# Patient Record
Sex: Female | Born: 1985 | Race: Black or African American | Hispanic: No | Marital: Single | Smoking: Current every day smoker
Health system: Southern US, Community
[De-identification: ages and names within clinical notes are randomized; demographics above are authoritative.]

## PROBLEM LIST (undated history)

## (undated) DIAGNOSIS — F319 Bipolar disorder, unspecified: Secondary | ICD-10-CM

## (undated) DIAGNOSIS — G43909 Migraine, unspecified, not intractable, without status migrainosus: Secondary | ICD-10-CM

## (undated) DIAGNOSIS — I10 Essential (primary) hypertension: Secondary | ICD-10-CM

## (undated) HISTORY — PX: WISDOM TOOTH EXTRACTION: SHX21

## (undated) HISTORY — PX: TONSILLECTOMY: SUR1361

---

## 2008-02-20 ENCOUNTER — Emergency Department (HOSPITAL_COMMUNITY): Admission: EM | Admit: 2008-02-20 | Discharge: 2008-02-20 | Payer: Self-pay | Admitting: Emergency Medicine

## 2008-06-01 ENCOUNTER — Emergency Department (HOSPITAL_COMMUNITY): Admission: EM | Admit: 2008-06-01 | Discharge: 2008-06-01 | Payer: Self-pay | Admitting: Emergency Medicine

## 2008-06-03 ENCOUNTER — Ambulatory Visit (HOSPITAL_COMMUNITY): Admission: RE | Admit: 2008-06-03 | Discharge: 2008-06-03 | Payer: Self-pay | Admitting: Emergency Medicine

## 2009-04-09 ENCOUNTER — Emergency Department (HOSPITAL_COMMUNITY): Admission: EM | Admit: 2009-04-09 | Discharge: 2009-04-09 | Payer: Self-pay | Admitting: Emergency Medicine

## 2010-06-24 LAB — URINE CULTURE

## 2010-06-24 LAB — POCT PREGNANCY, URINE: Preg Test, Ur: NEGATIVE

## 2010-06-24 LAB — COMPREHENSIVE METABOLIC PANEL
ALT: 25 U/L (ref 0–35)
Albumin: 3.2 g/dL — ABNORMAL LOW (ref 3.5–5.2)
Alkaline Phosphatase: 115 U/L (ref 39–117)
Chloride: 102 mEq/L (ref 96–112)
Potassium: 3.7 mEq/L (ref 3.5–5.1)
Sodium: 136 mEq/L (ref 135–145)
Total Protein: 6.9 g/dL (ref 6.0–8.3)

## 2010-06-24 LAB — URINALYSIS, ROUTINE W REFLEX MICROSCOPIC
Hgb urine dipstick: NEGATIVE
Nitrite: NEGATIVE
Protein, ur: NEGATIVE mg/dL
Urobilinogen, UA: 0.2 mg/dL (ref 0.0–1.0)

## 2010-06-24 LAB — DIFFERENTIAL
Basophils Relative: 0 % (ref 0–1)
Eosinophils Absolute: 0.2 10*3/uL (ref 0.0–0.7)
Eosinophils Relative: 3 % (ref 0–5)
Monocytes Absolute: 0.6 10*3/uL (ref 0.1–1.0)
Monocytes Relative: 8 % (ref 3–12)

## 2010-06-24 LAB — URINE MICROSCOPIC-ADD ON

## 2010-06-24 LAB — CBC
Hemoglobin: 10.4 g/dL — ABNORMAL LOW (ref 12.0–15.0)
Platelets: 348 10*3/uL (ref 150–400)
RDW: 17.3 % — ABNORMAL HIGH (ref 11.5–15.5)
WBC: 7.4 10*3/uL (ref 4.0–10.5)

## 2010-12-17 LAB — URINALYSIS, ROUTINE W REFLEX MICROSCOPIC
Glucose, UA: NEGATIVE mg/dL
Hgb urine dipstick: NEGATIVE
pH: 6 (ref 5.0–8.0)

## 2010-12-17 LAB — POCT I-STAT, CHEM 8
Calcium, Ion: 1.15 mmol/L (ref 1.12–1.32)
Chloride: 104 mEq/L (ref 96–112)
HCT: 35 % — ABNORMAL LOW (ref 36.0–46.0)
Potassium: 4.2 mEq/L (ref 3.5–5.1)

## 2010-12-17 LAB — URINE MICROSCOPIC-ADD ON

## 2011-03-01 ENCOUNTER — Ambulatory Visit: Payer: Self-pay

## 2014-08-13 ENCOUNTER — Encounter (HOSPITAL_COMMUNITY): Payer: Self-pay

## 2014-08-13 ENCOUNTER — Emergency Department (HOSPITAL_COMMUNITY)
Admission: EM | Admit: 2014-08-13 | Discharge: 2014-08-13 | Disposition: A | Discharge status: Home / Self Care | Payer: Self-pay | Attending: Emergency Medicine | Admitting: Emergency Medicine

## 2014-08-13 DIAGNOSIS — F329 Major depressive disorder, single episode, unspecified: Secondary | ICD-10-CM | POA: Insufficient documentation

## 2014-08-13 DIAGNOSIS — Z720 Tobacco use: Secondary | ICD-10-CM | POA: Insufficient documentation

## 2014-08-13 DIAGNOSIS — F32A Depression, unspecified: Secondary | ICD-10-CM

## 2014-08-13 LAB — COMPREHENSIVE METABOLIC PANEL
ALT: 16 U/L (ref 14–54)
AST: 17 U/L (ref 15–41)
Albumin: 4 g/dL (ref 3.5–5.0)
Alkaline Phosphatase: 117 U/L (ref 38–126)
Anion gap: 6 (ref 5–15)
BILIRUBIN TOTAL: 0.2 mg/dL — AB (ref 0.3–1.2)
BUN: 8 mg/dL (ref 6–20)
CALCIUM: 9 mg/dL (ref 8.9–10.3)
CO2: 27 mmol/L (ref 22–32)
Chloride: 103 mmol/L (ref 101–111)
Creatinine, Ser: 0.84 mg/dL (ref 0.44–1.00)
GFR calc Af Amer: >60 mL/min (ref >60)
Glucose, Bld: 96 mg/dL (ref 65–99)
POTASSIUM: 3.7 mmol/L (ref 3.5–5.1)
Sodium: 136 mmol/L (ref 135–145)
Total Protein: 8.4 g/dL — ABNORMAL HIGH (ref 6.5–8.1)

## 2014-08-13 LAB — CBC
HEMATOCRIT: 34.6 % — AB (ref 36.0–46.0)
HEMOGLOBIN: 11 g/dL — AB (ref 12.0–15.0)
MCH: 26.1 pg (ref 26.0–34.0)
MCHC: 31.8 g/dL (ref 30.0–36.0)
MCV: 82 fL (ref 78.0–100.0)
Platelets: 413 10*3/uL — ABNORMAL HIGH (ref 150–400)
RBC: 4.22 MIL/uL (ref 3.87–5.11)
RDW: 16.2 % — AB (ref 11.5–15.5)
WBC: 7.8 10*3/uL (ref 4.0–10.5)

## 2014-08-13 LAB — ACETAMINOPHEN LEVEL

## 2014-08-13 LAB — SALICYLATE LEVEL

## 2014-08-13 LAB — ETHANOL: Alcohol, Ethyl (B): <5 mg/dL (ref <5)

## 2014-08-13 NOTE — Discharge Instructions (Signed)
Please call 911 if you have any thoughts of hurting yourself. Please contact [facility] tomorrow-  i really think that you have depression,and i think they will be able to help you out.   Depression Depression refers to feeling sad, low, down in the dumps, blue, gloomy, or empty. In general, there are two kinds of depression:  Normal sadness or normal grief. This kind of depression is one that we all feel from time to time after upsetting life experiences, such as the loss of a job or the ending of a relationship. This kind of depression is considered normal, is short lived, and resolves within a few days to 2 weeks. Depression experienced after the loss of a loved one (bereavement) often lasts longer than 2 weeks but normally gets better with time.  Clinical depression. This kind of depression lasts longer than normal sadness or normal grief or interferes with your ability to function at home, at work, and in school. It also interferes with your personal relationships. It affects almost every aspect of your life. Clinical depression is an illness. Symptoms of depression can also be caused by conditions other than those mentioned above, such as:  Physical illness. Some physical illnesses, including underactive thyroid gland (hypothyroidism), severe anemia, specific types of cancer, diabetes, uncontrolled seizures, heart and lung problems, strokes, and chronic pain are commonly associated with symptoms of depression.  Side effects of some prescription medicine. In some people, certain types of medicine can cause symptoms of depression.  Substance abuse. Abuse of alcohol and illicit drugs can cause symptoms of depression. SYMPTOMS Symptoms of normal sadness and normal grief include the following:  Feeling sad or crying for short periods of time.  Not caring about anything (apathy).  Difficulty sleeping or sleeping too much.  No longer able to enjoy the things you used to enjoy.  Desire to be  by oneself all the time (social isolation).  Lack of energy or motivation.  Difficulty concentrating or remembering.  Change in appetite or weight.  Restlessness or agitation. Symptoms of clinical depression include the same symptoms of normal sadness or normal grief and also the following symptoms:  Feeling sad or crying all the time.  Feelings of guilt or worthlessness.  Feelings of hopelessness or helplessness.  Thoughts of suicide or the desire to harm yourself (suicidal ideation).  Loss of touch with reality (psychotic symptoms). Seeing or hearing things that are not real (hallucinations) or having false beliefs about your life or the people around you (delusions and paranoia). DIAGNOSIS  The diagnosis of clinical depression is usually based on how bad the symptoms are and how long they have lasted. Your health care provider will also ask you questions about your medical history and substance use to find out if physical illness, use of prescription medicine, or substance abuse is causing your depression. Your health care provider may also order blood tests. TREATMENT  Often, normal sadness and normal grief do not require treatment. However, sometimes antidepressant medicine is given for bereavement to ease the depressive symptoms until they resolve. The treatment for clinical depression depends on how bad the symptoms are but often includes antidepressant medicine, counseling with a mental health professional, or both. Your health care provider will help to determine what treatment is best for you. Depression caused by physical illness usually goes away with appropriate medical treatment of the illness. If prescription medicine is causing depression, talk with your health care provider about stopping the medicine, decreasing the dose, or changing to another  medicine. Depression caused by the abuse of alcohol or illicit drugs goes away when you stop using these substances. Some adults  need professional help in order to stop drinking or using drugs. SEEK IMMEDIATE MEDICAL CARE IF:  You have thoughts about hurting yourself or others.  You lose touch with reality (have psychotic symptoms).  You are taking medicine for depression and have a serious side effect. FOR MORE INFORMATION  National Alliance on Mental Illness: www.nami.[employer] of Mental Health: http://www.maynard.net/ Document Released: 02/26/2000 Document Revised: 07/15/2013 Document Reviewed: 05/30/2011 [facility] Patient Information 2015 [place], [place]. This information is not intended to replace advice given to you by your health care provider. Make sure you discuss any questions you have with your health care provider.  Suicidal Feelings, How to Help Yourself Everyone feels sad or unhappy at times, but depressing thoughts and feelings of hopelessness can lead to thoughts of suicide. It can seem as if life is too tough to handle. If you feel as though you have reached the point where suicide is the only answer, it is time to let someone know immediately.  HOW TO COPE AND PREVENT SUICIDE  Let family, friends, teachers, or counselors know. Get help. Try not to isolate yourself from those who care about you. Even though you may not feel sociable, talk with someone every day. It is best if it is face-to-face. Remember, they will want to help you.  Eat a regularly spaced and well-balanced diet.  Get plenty of rest.  Avoid alcohol and drugs because they will only make you feel worse and may also lower your inhibitions. Remove them from the home. If you are thinking of taking an overdose of your prescribed medicines, give your medicines to someone who can give them to you one day at a time. If you are on antidepressants, let your caregiver know of your feelings so he or she can provide a safer medicine, if that is a concern.  Remove weapons or poisons from your home.  Try to stick to routines. Follow a  schedule and remind yourself that you have to keep that schedule every day.  Set some realistic goals and achieve them. Make a list and cross things off as you go. Accomplishments give a sense of worth. Wait until you are feeling better before doing things you find difficult or unpleasant to do.  If you are able, try to start exercising. Even half-hour periods of exercise each day will make you feel better. Getting out in the sun or into nature helps you recover from depression faster. If you have a favorite place to walk, take advantage of that.  Increase safe activities that have always given you pleasure. This may include playing your favorite music, reading a good book, painting a picture, or playing your favorite instrument. Do whatever takes your mind off your depression.  Keep your living space well-lighted. GET HELP Contact a suicide hotline, crisis center, or local suicide prevention center for help right away. Local centers may include a hospital, clinic, community service organization, social service provider, or health department.  Call your local emergency services (911 in the [place]).  Call a suicide hotline:  1-800-273-TALK (831-146-4035) in the [place].  1-800-SUICIDE (661) 222-6946) in the [place].  630-587-1790 in the [place] for Spanish-speaking counselors.  4-696-295-2WUX (737)036-4494) in the [place] for TTY users.  Visit the following websites for information and help:  National Suicide Prevention Lifeline: www.suicidepreventionlifeline.org  Hopeline: www.hopeline.com  [employer]  for Suicide Prevention: https://www.ayers.com/  For lesbian, gay, bisexual, transgender, or questioning youth, contact [employer]:  1-610-9-U-EAVWUJ 603-391-3025) in the [place].  www.thetrevorproject.org  In [place], treatment resources are listed in each province with listings available under [employer] or  similar titles. Another source for Crisis Centres by [place] is located at http://www.suicideprevention.ca/in-crisis-now/find-a-crisis-centre-now/crisis-centres Document Released: 09/04/2002 Document Revised: 05/23/2011 Document Reviewed: 06/25/2013 [facility] Patient Information 2015 [place], [place]. This information is not intended to replace advice given to you by your health care provider. Make sure you discuss any questions you have with your health care provider.

## 2014-08-13 NOTE — ED Notes (Signed)
Pt presents with c/o suicidal thoughts. Pt reports she was calling to seek some help. Visitor at bedside reported mobile crisis was called and that pt made a statement about having past thoughts about driving her car off of the bridge. Visitor also stated that last night pt stated that "sometimes she thinks it would be better if she wasn't here but that she still has stuff to do". Pt denies feeling suicidal but reports "sometimes I think it would be better if I wasn't here". Pt denies homicidal thoughts. Calm and cooperative at this time.

## 2014-08-13 NOTE — ED Notes (Signed)
MD at bedside. 

## 2014-08-13 NOTE — ED Provider Notes (Signed)
CSN: [id number]     Arrival date & time 08/13/14  0132 History   First MD Initiated Contact with Patient 08/13/14 [phone]     Chief Complaint  Patient presents with  . Suicidal     (Consider location/radiation/quality/duration/timing/severity/associated sxs/prior Treatment) HPI Comments: Pt brought in by the crisis team for suicidal thoughts. PT has no psych hx, medical hx and has no substance abuse hx. She reports that she had a really rough patch 2 years ago, that has left her scarred. She was getting very emotional today, and decided to "vent out" and open up to someone, rather then suffer by herself, so she called a suicide crisis line. She spoke with someone about her previous thoughts/plans of hurting herself (driving down a bridge), 2 years ago and 1 years ago. She told them how she gets suicidal thoughts even now when she gets down - but that she has no plans, and that she is looking forward to settling down, getting married and have kids. Pt however was sent to the Er for further evaluation. She reports she absolutely has no suicidal plans and that she is considering moving back to [place] - where she has more support system. Pt is not currently employed. She has no new stress. She reports that she was on her way to buy some Chicken before the crisis team got there, and she has a dog to take care of.  The history is provided by the patient.    History reviewed. No pertinent past medical history. Past Surgical History  Procedure Laterality Date  . Tonsillectomy    . Wisdom tooth extraction     No family history on file. History  Substance Use Topics  . Smoking status: Current Every Day Smoker  . Smokeless tobacco: Not on file  . Alcohol Use: No   OB History    No data available     Review of Systems  Constitutional: Positive for activity change.  Gastrointestinal: Negative for nausea.  Psychiatric/Behavioral: Negative for suicidal ideas, hallucinations and self-injury.       Allergies  Review of patient's allergies indicates no known allergies.  Home Medications   Prior to Admission medications   Medication Sig Start Date End Date Taking? Authorizing Provider  acetaminophen (TYLENOL) 500 MG tablet Take 500-1,000 mg by mouth every 6 (six) hours as needed (for pain.).   Yes Historical Provider, MD   BP 149/117 mmHg  Pulse 92  Temp(Src) 98.7 F (37.1 C) (Oral)  Resp 20  SpO2 100%  LMP 07/20/2014 (Exact Date) Physical Exam  Constitutional: She is oriented to person, place, and time. She appears well-developed and well-nourished.  HENT:  Head: Normocephalic and atraumatic.  Eyes: EOM are normal. Pupils are equal, round, and reactive to light.  Neck: Neck supple.  Cardiovascular: Normal rate, regular rhythm and normal heart sounds.   No murmur heard. Pulmonary/Chest: Effort normal. No respiratory distress.  Abdominal: Soft. She exhibits no distension. There is no tenderness. There is no rebound and no guarding.  Neurological: She is alert and oriented to person, place, and time.  Skin: Skin is warm and dry.  Psychiatric: She has a normal mood and affect. Her behavior is normal. Judgment and thought content normal.  Nursing note and vitals reviewed.   ED Course  Procedures (including critical care time) Labs Review Labs Reviewed  ACETAMINOPHEN LEVEL - Abnormal; Notable for the following:    Acetaminophen (Tylenol), Serum <10 (*)    All other components within normal limits  CBC - Abnormal; Notable for the following:    Hemoglobin 11.0 (*)    HCT 34.6 (*)    RDW 16.2 (*)    Platelets 413 (*)    All other components within normal limits  COMPREHENSIVE METABOLIC PANEL - Abnormal; Notable for the following:    Total Protein 8.4 (*)    Total Bilirubin 0.2 (*)    All other components within normal limits  ETHANOL  SALICYLATE LEVEL  URINE RAPID DRUG SCREEN (HOSP PERFORMED) NOT AT [facility]  POC URINE PREG, ED    Imaging Review No results  found.   EKG Interpretation None      MDM   Final diagnoses:  Depression    Pt sent here for evaluation for suicidal thoughts. Currently, she vehemently denies any suicidal plans. She did feels down, and has had even suicidal thoughts - but she definitely has no plans. She has thinks to look forward to...have kids, take care of her dogs, move closer to her family.  Now she has never been diagnosed with depression, and we both agree that she likely is depressed. She has a phone # for [facility] and intends to call the tomorrow.  Pt has no medical problems, no access to guns, she has no pills at home, she has no hx of substance abuse - all in her favor. She has poor social support and is unemployed, which are red flags.  However, after speaking with this lady for 15-20 minutes-  i believe her when she states that she indeed wants to be happier and is looking forward to a certain things in her future.   Derwood KaplanAnkit Millena Callins, MD 08/13/14 [phone]

## 2015-07-11 ENCOUNTER — Encounter (HOSPITAL_COMMUNITY): Payer: Self-pay | Admitting: Emergency Medicine

## 2015-07-11 ENCOUNTER — Emergency Department (HOSPITAL_COMMUNITY): Payer: BLUE CROSS/BLUE SHIELD

## 2015-07-11 ENCOUNTER — Emergency Department (HOSPITAL_COMMUNITY)
Admission: EM | Admit: 2015-07-11 | Discharge: 2015-07-11 | Disposition: A | Discharge status: Home / Self Care | Payer: BLUE CROSS/BLUE SHIELD | Attending: Emergency Medicine | Admitting: Emergency Medicine

## 2015-07-11 DIAGNOSIS — R42 Dizziness and giddiness: Secondary | ICD-10-CM | POA: Diagnosis not present

## 2015-07-11 DIAGNOSIS — F17200 Nicotine dependence, unspecified, uncomplicated: Secondary | ICD-10-CM | POA: Insufficient documentation

## 2015-07-11 DIAGNOSIS — G43809 Other migraine, not intractable, without status migrainosus: Secondary | ICD-10-CM | POA: Diagnosis not present

## 2015-07-11 DIAGNOSIS — R51 Headache: Secondary | ICD-10-CM | POA: Diagnosis present

## 2015-07-11 LAB — CBC WITH DIFFERENTIAL/PLATELET
BASOS PCT: 0 %
Basophils Absolute: 0 10*3/uL (ref 0.0–0.1)
EOS ABS: 0.2 10*3/uL (ref 0.0–0.7)
EOS PCT: 3 %
HCT: 34.9 % — ABNORMAL LOW (ref 36.0–46.0)
HEMOGLOBIN: 11.3 g/dL — AB (ref 12.0–15.0)
Lymphocytes Relative: 24 %
Lymphs Abs: 1.6 10*3/uL (ref 0.7–4.0)
MCH: 27.6 pg (ref 26.0–34.0)
MCHC: 32.4 g/dL (ref 30.0–36.0)
MCV: 85.3 fL (ref 78.0–100.0)
Monocytes Absolute: 0.6 10*3/uL (ref 0.1–1.0)
Monocytes Relative: 9 %
NEUTROS PCT: 64 %
Neutro Abs: 4.4 10*3/uL (ref 1.7–7.7)
PLATELETS: 358 10*3/uL (ref 150–400)
RBC: 4.09 MIL/uL (ref 3.87–5.11)
RDW: 18.3 % — ABNORMAL HIGH (ref 11.5–15.5)
WBC: 6.8 10*3/uL (ref 4.0–10.5)

## 2015-07-11 LAB — BASIC METABOLIC PANEL
ANION GAP: 9 (ref 5–15)
BUN: 7 mg/dL (ref 6–20)
CALCIUM: 9 mg/dL (ref 8.9–10.3)
CO2: 26 mmol/L (ref 22–32)
Chloride: 104 mmol/L (ref 101–111)
Creatinine, Ser: 0.82 mg/dL (ref 0.44–1.00)
GFR calc Af Amer: >60 mL/min (ref >60)
GLUCOSE: 104 mg/dL — AB (ref 65–99)
Potassium: 3.4 mmol/L — ABNORMAL LOW (ref 3.5–5.1)
SODIUM: 139 mmol/L (ref 135–145)

## 2015-07-11 MED ORDER — DIPHENHYDRAMINE HCL 50 MG/ML IJ SOLN
25.0000 mg | Freq: Once | INTRAMUSCULAR | Status: AC
  Administered 2015-07-11: 25 mg via INTRAVENOUS
  Filled 2015-07-11: qty 1

## 2015-07-11 MED ORDER — PROCHLORPERAZINE EDISYLATE 5 MG/ML IJ SOLN
10.0000 mg | Freq: Once | INTRAMUSCULAR | Status: AC
  Administered 2015-07-11: 10 mg via INTRAVENOUS
  Filled 2015-07-11: qty 2

## 2015-07-11 MED ORDER — KETOROLAC TROMETHAMINE 30 MG/ML IJ SOLN
30.0000 mg | Freq: Once | INTRAMUSCULAR | Status: AC
  Administered 2015-07-11: 30 mg via INTRAVENOUS
  Filled 2015-07-11: qty 1

## 2015-07-11 NOTE — ED Provider Notes (Signed)
CSN: [id number]     Arrival date & time 07/11/15  0225 History  By signing my name below, I, Emmanuella Mensah, attest that this documentation has been prepared under the direction and in the presence of Gilda Creasehristopher J Evangeline Utley, MD. Electronically Signed: Angelene GiovanniEmmanuella Mensah, ED Scribe. 07/11/2015. 2:39 AM.    Chief Complaint  Patient presents with  . Headache   Patient is a 30 y.o. female presenting with headaches. The history is provided by the patient. No language interpreter was used.  Headache Pain location:  Frontal Radiates to:  Does not radiate Onset quality:  Sudden Timing:  Constant Progression:  Worsening Chronicity:  Recurrent Similar to prior headaches: more severe.   Relieved by:  None tried Worsened by:  Nothing Ineffective treatments:  None tried Associated symptoms: dizziness and nausea   Associated symptoms: no fever, no syncope and no vomiting    HPI Comments: Emma Haynes is a 30 y.o. female who presents to the Emergency Department complaining of gradually worsening severe frontal HA onset 3 hours ago that woke her up from her sleep. She reports associated dizziness, nausea, and photophobia. She explains that she has a hx of migraines but not to this extent. No alleviating factors noted. Pt has not tried any medications prior to the arrival of the ambulance. No fever or vomiting.    History reviewed. No pertinent past medical history. Past Surgical History  Procedure Laterality Date  . Tonsillectomy    . Wisdom tooth extraction     Family History  Problem Relation Age of Onset  . Diabetes Mother   . Hypertension Mother    Social History  Substance Use Topics  . Smoking status: Current Every Day Smoker  . Smokeless tobacco: None  . Alcohol Use: No   OB History    No data available     Review of Systems  Constitutional: Negative for fever.  Cardiovascular: Negative for syncope.  Gastrointestinal: Positive for nausea. Negative for vomiting.   Neurological: Positive for dizziness and headaches.  All other systems reviewed and are negative.     Allergies  Review of patient's allergies indicates no known allergies.  Home Medications   Prior to Admission medications   Medication Sig Start Date End Date Taking? Authorizing Provider  acetaminophen (TYLENOL) 500 MG tablet Take 500-1,000 mg by mouth every 6 (six) hours as needed for mild pain or headache.    Yes Historical Provider, MD  RaNITidine HCl (ZANTAC PO) Take 1 tablet by mouth daily as needed (acid reflux).   Yes Historical Provider, MD   BP 142/73 mmHg  Pulse 60  Temp(Src) 98.6 F (37 C) (Oral)  Resp 20  Ht 5\' 6"  (1.676 m)  Wt 240 lb (108.863 kg)  BMI 38.76 kg/m2  SpO2 98%  LMP 07/10/2015 (Exact Date) Physical Exam  Constitutional: She is oriented to person, place, and time. She appears well-developed and well-nourished. No distress.  HENT:  Head: Normocephalic and atraumatic.  Right Ear: Hearing normal.  Left Ear: Hearing normal.  Nose: Nose normal.  Mouth/Throat: Oropharynx is clear and moist and mucous membranes are normal.  Eyes: Conjunctivae and EOM are normal. Pupils are equal, round, and reactive to light.  Neck: Normal range of motion. Neck supple.  Cardiovascular: Regular rhythm, S1 normal and S2 normal.  Exam reveals no gallop and no friction rub.   No murmur heard. Pulmonary/Chest: Effort normal and breath sounds normal. No respiratory distress. She exhibits no tenderness.  Abdominal: Soft. Normal appearance and bowel sounds are  normal. There is no hepatosplenomegaly. There is no tenderness. There is no rebound, no guarding, no tenderness at McBurney's point and negative Murphy's sign. No hernia.  Musculoskeletal: Normal range of motion.  Neurological: She is alert and oriented to person, place, and time. She has normal strength. No cranial nerve deficit or sensory deficit. Coordination normal. GCS eye subscore is 4. GCS verbal subscore is 5. GCS  motor subscore is 6.  Skin: Skin is warm, dry and intact. No rash noted. No cyanosis.  Psychiatric: She has a normal mood and affect. Her speech is normal and behavior is normal. Thought content normal.  Nursing note and vitals reviewed.   ED Course  Procedures (including critical care time) DIAGNOSTIC STUDIES: Oxygen Saturation is 100% on RA, normal by my interpretation.    COORDINATION OF CARE: 2:35 AM- Pt advised of plan for treatment and pt agrees. Pt will receive Head CT and lab work for further evaluation.    Labs Review Labs Reviewed  CBC WITH DIFFERENTIAL/PLATELET - Abnormal; Notable for the following:    Hemoglobin 11.3 (*)    HCT 34.9 (*)    RDW 18.3 (*)    All other components within normal limits  BASIC METABOLIC PANEL - Abnormal; Notable for the following:    Potassium 3.4 (*)    Glucose, Bld 104 (*)    All other components within normal limits    Imaging Review Ct Head Wo Contrast  07/11/2015  CLINICAL DATA:  Gradually worsening severe frontal headache associated with dizziness, nausea and photophobia. History of migraines. EXAM: CT HEAD WITHOUT CONTRAST TECHNIQUE: Contiguous axial images were obtained from the base of the skull through the vertex without intravenous contrast. COMPARISON:  None. FINDINGS: INTRACRANIAL CONTENTS: The ventricles and sulci are normal. No intraparenchymal hemorrhage, mass effect nor midline shift. No acute large vascular territory infarcts. No abnormal extra-axial fluid collections. Basal cisterns are patent. ORBITS: The included ocular globes and orbital contents are normal. SINUSES: The mastoid aircells and included paranasal sinuses are well-aerated. Coalescent LEFT mastoid air cell suggests prior mastoiditis without acute component. SKULL/SOFT TISSUES: No skull fracture. No significant soft tissue swelling. IMPRESSION: Normal CT HEAD. Electronically Signed   By: Awilda Metro M.D.   On: 07/11/2015 04:59     Gilda Crease, MD  has personally reviewed and evaluated these images and lab results as part of his medical decision-making.   EKG Interpretation None      MDM   Final diagnoses:  Other type of migraine    Patient presents to the emergency department for evaluation of migraine headache. Patient reports that she had onset of headache 3 hours before arrival in the ER. She does have a history of recurrent headaches, but has not had a formal diagnosis of migraine. Because the headache is worse than it has been in the past, CT scan was performed. It was negative. Negative CT scan within 3 hours of onset of bleeding is a good indicator that there is no evidence of subarachnoid hemorrhage. She also had a normal neurologic exam and symptoms were very consistent with migraine. She had resolution of her headache with migraine treatment. Patient was referred to neurology for further management.  I personally performed the services described in this documentation, which was scribed in my presence. The recorded information has been reviewed and is accurate.    Gilda Crease, MD 07/11/15 [phone]

## 2015-07-11 NOTE — ED Notes (Signed)
Per EMS: Pt from home complaining of migraine x 3 hours. Hx of same, never diagnosed. Pt nauseated, EMS gave 8mg  zofran enroute. No improvement. Pt a/o x 4.

## 2015-07-11 NOTE — Discharge Instructions (Signed)

## 2016-04-26 ENCOUNTER — Emergency Department (HOSPITAL_COMMUNITY)
Admission: EM | Admit: 2016-04-26 | Discharge: 2016-04-27 | Disposition: A | Discharge status: Home / Self Care | Payer: BLUE CROSS/BLUE SHIELD | Attending: Emergency Medicine | Admitting: Emergency Medicine

## 2016-04-26 ENCOUNTER — Encounter (HOSPITAL_COMMUNITY): Payer: Self-pay | Admitting: *Deleted

## 2016-04-26 DIAGNOSIS — F17200 Nicotine dependence, unspecified, uncomplicated: Secondary | ICD-10-CM | POA: Diagnosis not present

## 2016-04-26 DIAGNOSIS — K2900 Acute gastritis without bleeding: Secondary | ICD-10-CM | POA: Diagnosis not present

## 2016-04-26 DIAGNOSIS — R51 Headache: Secondary | ICD-10-CM | POA: Diagnosis not present

## 2016-04-26 DIAGNOSIS — R110 Nausea: Secondary | ICD-10-CM | POA: Diagnosis present

## 2016-04-26 DIAGNOSIS — R519 Headache, unspecified: Secondary | ICD-10-CM

## 2016-04-26 HISTORY — DX: Migraine, unspecified, not intractable, without status migrainosus: G43.909

## 2016-04-26 LAB — I-STAT CHEM 8, ED
BUN: 8 mg/dL (ref 6–20)
CALCIUM ION: 1.13 mmol/L — AB (ref 1.15–1.40)
Chloride: 100 mmol/L — ABNORMAL LOW (ref 101–111)
Creatinine, Ser: 0.8 mg/dL (ref 0.44–1.00)
Glucose, Bld: 100 mg/dL — ABNORMAL HIGH (ref 65–99)
HEMATOCRIT: 32 % — AB (ref 36.0–46.0)
HEMOGLOBIN: 10.9 g/dL — AB (ref 12.0–15.0)
Potassium: 5.6 mmol/L — ABNORMAL HIGH (ref 3.5–5.1)
SODIUM: 137 mmol/L (ref 135–145)
TCO2: 31 mmol/L (ref 0–100)

## 2016-04-26 LAB — I-STAT BETA HCG BLOOD, ED (MC, WL, AP ONLY)

## 2016-04-26 MED ORDER — PANTOPRAZOLE SODIUM 40 MG IV SOLR
40.0000 mg | Freq: Once | INTRAVENOUS | Status: AC
  Administered 2016-04-26: 40 mg via INTRAVENOUS
  Filled 2016-04-26: qty 40

## 2016-04-26 MED ORDER — KETOROLAC TROMETHAMINE 30 MG/ML IJ SOLN
30.0000 mg | Freq: Once | INTRAMUSCULAR | Status: AC
  Administered 2016-04-26: 30 mg via INTRAVENOUS
  Filled 2016-04-26: qty 1

## 2016-04-26 MED ORDER — GI COCKTAIL ~~LOC~~
30.0000 mL | Freq: Once | ORAL | Status: AC
  Administered 2016-04-26: 30 mL via ORAL
  Filled 2016-04-26: qty 30

## 2016-04-26 MED ORDER — METOCLOPRAMIDE HCL 5 MG/ML IJ SOLN
10.0000 mg | Freq: Once | INTRAMUSCULAR | Status: AC
  Administered 2016-04-26: 10 mg via INTRAVENOUS
  Filled 2016-04-26: qty 2

## 2016-04-26 NOTE — ED Provider Notes (Signed)
[facility] Provider Note   CSN: [id number] Arrival date & time: 04/26/16  2129   By signing my name below, I, Marnette Burgess Long, attest that this documentation has been prepared under the direction and in the presence of [employer], PA-C. Electronically Signed: Marnette Burgess Long, Scribe. 04/26/2016. 11:43 PM.   History   Chief Complaint Chief Complaint  Patient presents with  . Nausea     The history is provided by the patient. No language interpreter was used.    HPI Comments:  Emma Haynes is a 31 y.o. female with a PMHx of Anemia, Gastritis, and h/o Migraines, who presents to the Emergency Department complaining of constant nausea onset three days. She notes the nausea has been present for the last three days but worsened today PTA. She has associated symptoms of a constant, dull HA and mild cough. She reports normal BM. She states taking some anti-emetics at home with no relief of her nausea. Eating exacerbates her symptoms. No h/o abdominal surgeries. She has a h/o migraines which lead to her being seen at [facility] in April 2017. She did not take anything at home today for relief of for her HA.  Pt denies fever, vomiting, rhinorrhea, diarrhea, urgency, frequency, hematuria, dysuria, difficulty urinating, and any other associated symptoms at this time. Pt did receive a seasonal influenza vaccination.     Past Medical History:  Diagnosis Date  . Migraine     There are no active problems to display for this patient.   Past Surgical History:  Procedure Laterality Date  . TONSILLECTOMY    . WISDOM TOOTH EXTRACTION      OB History    No data available       Home Medications    Prior to Admission medications   Medication Sig Start Date End Date Taking? Authorizing Provider  acetaminophen (TYLENOL) 500 MG tablet Take 500-1,000 mg by mouth every 6 (six) hours as needed for mild pain or headache.    Yes Historical Provider, MD  RaNITidine HCl (ZANTAC PO) Take 1 tablet  by mouth daily as needed (acid reflux).   Yes Historical Provider, MD  butalbital-acetaminophen-caffeine (FIORICET, ESGIC) [phone] MG tablet Take 1-2 tablets by mouth every 8 (eight) hours as needed for headache. 04/27/16 04/27/17  Antony Madura, PA-C  pantoprazole (PROTONIX) 20 MG tablet Take 1 tablet (20 mg total) by mouth daily. 04/27/16   Antony Madura, PA-C    Family History Family History  Problem Relation Age of Onset  . Diabetes Mother   . Hypertension Mother     Social History Social History  Substance Use Topics  . Smoking status: Current Every Day Smoker  . Smokeless tobacco: Not on file  . Alcohol use No     Allergies   Patient has no known allergies.   Review of Systems Review of Systems 10 systems reviewed and all are negative for acute change except as noted in the HPI.    Physical Exam Updated Vital Signs BP 155/97 (BP Location: Right Arm)   Pulse 62   Temp 99.4 F (37.4 C) (Oral)   Resp 17   Ht 5\' 5"  (1.651 m)   Wt 108.4 kg   LMP 04/16/2016   SpO2 99%   BMI 39.77 kg/m   Physical Exam  Constitutional: She is oriented to person, place, and time. She appears well-developed and well-nourished. No distress.  Nontoxic and in NAD  HENT:  Head: Normocephalic and atraumatic.  Eyes: Conjunctivae and EOM are normal. No  scleral icterus.  Neck: Normal range of motion.  No nuchal rigidity or meningismus  Cardiovascular: Normal rate, regular rhythm and intact distal pulses.   Pulmonary/Chest: Effort normal. No respiratory distress. She has no wheezes. She has no rales.  Respirations even and unlabored. Lungs clear bilaterally.  Abdominal: Soft. She exhibits no distension and no mass. There is no guarding.  Soft abdomen without focal tenderness. No masses or peritoneal signs. No guarding or rigidity.  Musculoskeletal: Normal range of motion.  Neurological: She is alert and oriented to person, place, and time. No cranial nerve deficit. She exhibits normal muscle  tone. Coordination normal.  GCS 15. Speech is goal oriented. No cranial nerve deficits appreciated; symmetric eyebrow raise, no facial drooping, tongue midline. Patient moving all extremities. Sensation to light touch intact. Patient ambulatory with steady gait.  Skin: Skin is warm and dry. No rash noted. She is not diaphoretic. No erythema. No pallor.  Psychiatric: She has a normal mood and affect. Her behavior is normal.  Nursing note and vitals reviewed.    ED Treatments / Results  DIAGNOSTIC STUDIES:  Oxygen Saturation is 100% on RA, normal by my interpretation.    COORDINATION OF CARE:  11:38 PM Discussed treatment plan with pt at bedside including lab work, anti-emetics, and migraine cocktail and pt agreed to plan. Discussed mildly elevated potassium level. No c/o chest pain. Suspect this to be secondary to mild hemolysis as no contributing history to influence this result. Patient verbalizes understanding.  1:19 AM Patient reassessed and states she feels better.  Labs (all labs ordered are listed, but only abnormal results are displayed) Labs Reviewed  I-STAT CHEM 8, ED - Abnormal; Notable for the following:       Result Value   Potassium 5.6 (*)    Chloride 100 (*)    Glucose, Bld 100 (*)    Calcium, Ion 1.13 (*)    Hemoglobin 10.9 (*)    HCT 32.0 (*)    All other components within normal limits  I-STAT BETA HCG BLOOD, ED ([facility], [facility], [facility] ONLY)    EKG  EKG Interpretation None       Radiology No results found.  Procedures Procedures (including critical care time)  Medications Ordered in ED Medications  ketorolac (TORADOL) 30 MG/ML injection 30 mg (30 mg Intravenous Given 04/26/16 2356)  metoCLOPramide (REGLAN) injection 10 mg (10 mg Intravenous Given 04/26/16 2356)  pantoprazole (PROTONIX) injection 40 mg (40 mg Intravenous Given 04/26/16 2355)  gi cocktail (Maalox,Lidocaine,Donnatal) (30 mLs Oral Given 04/26/16 2356)     Initial Impression / Assessment and Plan  / ED Course  I have reviewed the triage vital signs and the nursing notes.  Pertinent labs & imaging results that were available during my care of the patient were reviewed by me and considered in my medical decision making (see chart for details).     31 year old female presents to the emergency department for 2 complaints. Primary complaint is of nausea with cough. She has not had any significant abdominal pain. No vomiting or diarrhea. No melena or hematochezia. She has had similar symptoms in the past associated with gastritis. I do believe this to be the cause of patient's nausea today. She has had relief of her nausea with Protonix and GI cocktail. She further reports an associated headache consistent with other headaches in the past. Headache has resolved with Reglan and Toradol. Patient resting comfortably on reassessment with no complaints. She is comfortable with further management on an outpatient basis.  Supportive medications prescribed and return precautions given. Patient discharged in stable condition with no unaddressed concerns.   Final Clinical Impressions(s) / ED Diagnoses   Final diagnoses:  Acute gastritis without hemorrhage, unspecified gastritis type  Acute nonintractable headache, unspecified headache type    New Prescriptions Discharge Medication List as of 04/27/2016  1:21 AM    START taking these medications   Details  butalbital-acetaminophen-caffeine (FIORICET, ESGIC) 50-325-40 MG tablet Take 1-2 tablets by mouth every 8 (eight) hours as needed for headache., Starting Wed 04/27/2016, Until Thu 04/27/2017, Print    pantoprazole (PROTONIX) 20 MG tablet Take 1 tablet (20 mg total) by mouth daily., Starting Wed 04/27/2016, Print       I personally performed the services described in this documentation, which was scribed in my presence. The recorded information has been reviewed and is accurate.       Antony MaduraKelly Eleni Frank, PA-C 04/27/16 [id number]    Shon Batonourtney F Horton,  MD 04/28/16 [id number]

## 2016-04-26 NOTE — ED Triage Notes (Signed)
Pt reports nausea for 3 days and a migraine since yesterday. Hx of migraines, no meds taken for the same. Denies fevers at home.

## 2016-04-27 MED ORDER — PANTOPRAZOLE SODIUM 20 MG PO TBEC: 20.0000 mg | DELAYED_RELEASE_TABLET | Freq: Every day | ORAL | 1 refills | Status: AC

## 2016-04-27 MED ORDER — BUTALBITAL-APAP-CAFFEINE 50-325-40 MG PO TABS: 1.0000 | ORAL_TABLET | Freq: Three times a day (TID) | ORAL | 0 refills | Status: AC | PRN

## 2017-04-19 ENCOUNTER — Encounter (HOSPITAL_COMMUNITY): Payer: Self-pay | Admitting: Emergency Medicine

## 2017-04-19 ENCOUNTER — Other Ambulatory Visit: Payer: Self-pay

## 2017-04-19 ENCOUNTER — Ambulatory Visit (HOSPITAL_COMMUNITY)
Admission: EM | Admit: 2017-04-19 | Discharge: 2017-04-19 | Disposition: A | Discharge status: Home / Self Care | Payer: BLUE CROSS/BLUE SHIELD | Attending: Family Medicine | Admitting: Family Medicine

## 2017-04-19 DIAGNOSIS — J209 Acute bronchitis, unspecified: Secondary | ICD-10-CM

## 2017-04-19 DIAGNOSIS — H1031 Unspecified acute conjunctivitis, right eye: Secondary | ICD-10-CM

## 2017-04-19 MED ORDER — AZITHROMYCIN 250 MG PO TABS: 250.0000 mg | ORAL_TABLET | Freq: Every day | ORAL | 0 refills | Status: AC

## 2017-04-19 MED ORDER — BENZONATATE 100 MG PO CAPS: 100.0000 mg | ORAL_CAPSULE | Freq: Three times a day (TID) | ORAL | 0 refills | Status: AC

## 2017-04-19 MED ORDER — OFLOXACIN 0.3 % OP SOLN: OPHTHALMIC | 0 refills | Status: AC

## 2017-04-19 NOTE — ED Provider Notes (Signed)
[facility]    CSN: [id number] Arrival date & time: 04/19/17  1557     History   Chief Complaint Chief Complaint  Patient presents with  . Eye Pain  . Eye Drainage  . Nasal Congestion    HPI Emma Haynes is a 32 y.o. female.   32 year old female comes in for 2-3 week history of URI symptoms, and 3 day history of eye drainage and pain.  She has had productive cough, nasal congestion, rhinorrhea.  Denies fever, chills, night sweats.  States has had worsening of productive cough this past few days.  She is noticed right eye drainage with redness for the past 2-3 days.  States it started out as foreign body sensation, now with crusting in the morning with intermittent blurry vision.  Denies photophobia, injury.  States she use OTC the pinkeye eyedrop.  She took 3 doses of amoxicillin, Mucinex, cough syrup for her URI symptoms with some relief.  She is a current some day smoker, states she smokes when she works.  Patient states with week vision of the left eye, is supposed to wear contact for the left eye, but does not.  States right eye vision is normal, and does not require contacts.      Past Medical History:  Diagnosis Date  . Migraine     There are no active problems to display for this patient.   Past Surgical History:  Procedure Laterality Date  . TONSILLECTOMY    . WISDOM TOOTH EXTRACTION      OB History    No data available       Home Medications    Prior to Admission medications   Medication Sig Start Date End Date Taking? Authorizing Provider  acetaminophen (TYLENOL) 500 MG tablet Take 500-1,000 mg by mouth every 6 (six) hours as needed for mild pain or headache.     [provider]  azithromycin (ZITHROMAX) 250 MG tablet Take 1 tablet (250 mg total) by mouth daily. Take first 2 tablets together, then 1 every day until finished. 04/19/17   Cathie Hoops, Amy V, PA-C  benzonatate (TESSALON) 100 MG capsule Take 1 capsule (100 mg total) by mouth every 8  (eight) hours. 04/19/17   Belinda Fisher, PA-C  butalbital-acetaminophen-caffeine (FIORICET, ESGIC) [phone] MG tablet Take 1-2 tablets by mouth every 8 (eight) hours as needed for headache. 04/27/16 04/27/17  Antony Madura, PA-C  ofloxacin (OCUFLOX) 0.3 % ophthalmic solution 1 drop every 4 hours for the first 2 days, then 4 times a day for 5 days 04/19/17   Cathie Hoops, Amy V, PA-C  pantoprazole (PROTONIX) 20 MG tablet Take 1 tablet (20 mg total) by mouth daily. 04/27/16   Antony Madura, PA-C  RaNITidine HCl (ZANTAC PO) Take 1 tablet by mouth daily as needed (acid reflux).    [provider]    Family History Family History  Problem Relation Age of Onset  . Diabetes Mother   . Hypertension Mother     Social History Social History   Tobacco Use  . Smoking status: Current Every Day Smoker  . Smokeless tobacco: Current User  Substance Use Topics  . Alcohol use: No  . Drug use: No     Allergies   Patient has no known allergies.   Review of Systems Review of Systems  Reason unable to perform ROS: See HPI as above.     Physical Exam Triage Vital Signs ED Triage Vitals  Enc Vitals Group     BP 04/19/17  1728 (!) 156/101     Pulse Rate 04/19/17 1728 71     Resp 04/19/17 1728 17     Temp 04/19/17 1728 98.3 F (36.8 C)     Temp Source 04/19/17 1728 Oral     SpO2 04/19/17 1728 100 %     Weight 04/19/17 1728 256 lb (116.1 kg)     Height 04/19/17 1728 5\' 6"  (1.676 m)     Head Circumference --      Peak Flow --      Pain Score 04/19/17 1729 7     Pain Loc --      Pain Edu? --      Excl. in GC? --    No data found.  Updated Vital Signs BP (!) 156/101 (BP Location: Left Arm)   Pulse 71   Temp 98.3 F (36.8 C) (Oral)   Resp 17   Ht 5\' 6"  (1.676 m)   Wt 256 lb (116.1 kg)   SpO2 100%   BMI 41.32 kg/m   Visual Acuity Right Eye Distance:   20/25 ([place]) Left Eye Distance:   20/400 ([place]) Bilateral Distance:    Right Eye Near:   Left Eye Near:    Bilateral Near:     Physical  Exam  Constitutional: She is oriented to person, place, and time. She appears well-developed and well-nourished. No distress.  HENT:  Head: Normocephalic and atraumatic.  Right Ear: Tympanic membrane, external ear and ear canal normal. Tympanic membrane is not erythematous and not bulging.  Left Ear: Tympanic membrane, external ear and ear canal normal. Tympanic membrane is not erythematous and not bulging.  Nose: Mucosal edema and rhinorrhea present. Right sinus exhibits no maxillary sinus tenderness and no frontal sinus tenderness. Left sinus exhibits no maxillary sinus tenderness and no frontal sinus tenderness.  Mouth/Throat: Uvula is midline, oropharynx is clear and moist and mucous membranes are normal.  Eyes: EOM and lids are normal. Pupils are equal, round, and reactive to light. Lids are everted and swept, no foreign bodies found. Right conjunctiva is injected. Left conjunctiva is not injected.  No ciliary injection.  Neck: Normal range of motion. Neck supple.  Cardiovascular: Normal rate, regular rhythm and normal heart sounds. Exam reveals no gallop and no friction rub.  No murmur heard. Pulmonary/Chest: Effort normal and breath sounds normal. She has no decreased breath sounds. She has no wheezes. She has no rhonchi. She has no rales.  Lymphadenopathy:    She has no cervical adenopathy.  Neurological: She is alert and oriented to person, place, and time.  Skin: Skin is warm and dry.  Psychiatric: She has a normal mood and affect. Her behavior is normal. Judgment normal.     UC Treatments / Results  Labs (all labs ordered are listed, but only abnormal results are displayed) Labs Reviewed - No data to display  EKG  EKG Interpretation None       Radiology No results found.  Procedures Procedures (including critical care time)  Medications Ordered in UC Medications - No data to display   Initial Impression / Assessment and Plan / UC Course  I have reviewed the  triage vital signs and the nursing notes.  Pertinent labs & imaging results that were available during my care of the patient were reviewed by me and considered in my medical decision making (see chart for details).    Azithromycin for bronchitis.  Other symptomatic treatment discussed.  Ofloxacin as directed for right eye conjunctivitis.  Warm  compress, lid scrubs discussed.  Patient with significantly worse vision of the left eye, which is baseline for her.  Return precautions given.  Patient expresses understanding and agrees to plan.  Final Clinical Impressions(s) / UC Diagnoses   Final diagnoses:  Acute bronchitis, unspecified organism  Acute conjunctivitis of right eye, unspecified acute conjunctivitis type    ED Discharge Orders        Ordered    benzonatate (TESSALON) 100 MG capsule  Every 8 hours     04/19/17 1757    azithromycin (ZITHROMAX) 250 MG tablet  Daily     04/19/17 1757    ofloxacin (OCUFLOX) 0.3 % ophthalmic solution     04/19/17 1757        Belinda Fisher, PA-C 04/19/17 1805

## 2017-04-19 NOTE — ED Triage Notes (Signed)
Pt. Stated, My eye has been draining and eye pain since Monday. Pt's eye draining. I've also had a cold for about 2 weeks.

## 2017-04-19 NOTE — Discharge Instructions (Signed)
Azithromycin as directed. Tessalon for cough. Start flonase, zyrtec-D for nasal congestion/drainage. You can use over the counter nasal saline rinse such as neti pot for nasal congestion. Keep hydrated, your urine should be clear to pale yellow in color. Tylenol/motrin for fever and pain. Monitor for any worsening of symptoms, chest pain, shortness of breath, wheezing, swelling of the throat, follow up for reevaluation.   Use ofloxacin eyedrops as directed on right eye. Lid scrubs and warm compresses as directed. Monitor for any worsening of symptoms, changes in vision, sensitivity to light, eye swelling, follow up with ophthalmology for further evaluation.

## 2017-04-24 ENCOUNTER — Ambulatory Visit (HOSPITAL_COMMUNITY)
Admission: EM | Admit: 2017-04-24 | Discharge: 2017-04-24 | Disposition: A | Discharge status: Home / Self Care | Payer: Self-pay | Attending: Family Medicine | Admitting: Family Medicine

## 2017-04-24 ENCOUNTER — Encounter (HOSPITAL_COMMUNITY): Payer: Self-pay | Admitting: Emergency Medicine

## 2017-04-24 DIAGNOSIS — S0501XA Injury of conjunctiva and corneal abrasion without foreign body, right eye, initial encounter: Secondary | ICD-10-CM

## 2017-04-24 MED ORDER — TETRACAINE HCL 0.5 % OP SOLN
OPHTHALMIC | Status: AC
  Filled 2017-04-24: qty 4

## 2017-04-24 MED ORDER — FLUORESCEIN SODIUM 0.6 MG OP STRP
ORAL_STRIP | OPHTHALMIC | Status: AC
  Filled 2017-04-24: qty 1

## 2017-04-24 NOTE — Discharge Instructions (Addendum)
Given new symptoms and exam today, go to the ophthalmologist for further evaluation today.

## 2017-04-24 NOTE — ED Provider Notes (Signed)
[facility]    CSN: [id number] Arrival date & time: 04/24/17  1232     History   Chief Complaint Chief Complaint  Patient presents with  . Eye Problem    HPI Bosie HelperJamika Mabus is a 32 y.o. female.   32 year old female comes in for continued right eye pain/irritation after being seen 5 days ago. At that time, she had eye redness and discharge without vision changes, photophobia, she was given ofloxacin eye drop for conjunctivitis, which she has been using for the past 5 days.  She reports gradual worsening of foreign body sensation.  States noticed blurry vision starting 2 days ago.  Denies photophobia.  She continues with ofloxacin eyedrop, lid scrubs, warm compress without relief.  States due to foreign body sensation, she has continued to rub her eye.      Past Medical History:  Diagnosis Date  . Migraine     There are no active problems to display for this patient.   Past Surgical History:  Procedure Laterality Date  . TONSILLECTOMY    . WISDOM TOOTH EXTRACTION      OB History    No data available       Home Medications    Prior to Admission medications   Medication Sig Start Date End Date Taking? Authorizing Provider  acetaminophen (TYLENOL) 500 MG tablet Take 500-1,000 mg by mouth every 6 (six) hours as needed for mild pain or headache.    Yes [provider]  azithromycin (ZITHROMAX) 250 MG tablet Take 1 tablet (250 mg total) by mouth daily. Take first 2 tablets together, then 1 every day until finished. 04/19/17  Yes Dorothe Elmore V, PA-C  ofloxacin (OCUFLOX) 0.3 % ophthalmic solution 1 drop every 4 hours for the first 2 days, then 4 times a day for 5 days 04/19/17  Yes Sigrid Schwebach V, PA-C  benzonatate (TESSALON) 100 MG capsule Take 1 capsule (100 mg total) by mouth every 8 (eight) hours. 04/19/17   Belinda FisherYu, Rami Waddle V, PA-C  butalbital-acetaminophen-caffeine (FIORICET, ESGIC) [phone] MG tablet Take 1-2 tablets by mouth every 8 (eight) hours as needed for headache.  04/27/16 04/27/17  Antony MaduraHumes, Kelly, PA-C  pantoprazole (PROTONIX) 20 MG tablet Take 1 tablet (20 mg total) by mouth daily. 04/27/16   Antony MaduraHumes, Kelly, PA-C  RaNITidine HCl (ZANTAC PO) Take 1 tablet by mouth daily as needed (acid reflux).    [provider]    Family History Family History  Problem Relation Age of Onset  . Diabetes Mother   . Hypertension Mother     Social History Social History   Tobacco Use  . Smoking status: Current Every Day Smoker  . Smokeless tobacco: Current User  Substance Use Topics  . Alcohol use: No  . Drug use: No     Allergies   Patient has no known allergies.   Review of Systems Review of Systems  Reason unable to perform ROS: See HPI as above.     Physical Exam Triage Vital Signs ED Triage Vitals  Enc Vitals Group     BP 04/24/17 1322 (!) 162/108     Pulse Rate 04/24/17 1322 81     Resp 04/24/17 1322 16     Temp 04/24/17 1322 98.2 F (36.8 C)     Temp Source 04/24/17 1322 Oral     SpO2 04/24/17 1322 100 %     Weight 04/24/17 1320 246 lb (111.6 kg)     Height 04/24/17 1320 5\' 6"  (1.676  m)     Head Circumference --      Peak Flow --      Pain Score 04/24/17 1319 7     Pain Loc --      Pain Edu? --      Excl. in GC? --    No data found.  Updated Vital Signs BP (!) 162/108 (BP Location: Right Arm)   Pulse 81   Temp 98.2 F (36.8 C) (Oral)   Resp 16   Ht 5\' 6"  (1.676 m)   Wt 246 lb (111.6 kg)   LMP 04/14/2017   SpO2 100%   BMI 39.71 kg/m   Visual Acuity Right Eye Distance: 20/70 Left Eye Distance: 20/200(pt states this is her normal, she is supposed to wear contacts) Bilateral Distance:    Right Eye Near:   Left Eye Near:    Bilateral Near:     Physical Exam  Constitutional: She is oriented to person, place, and time. She appears well-developed and well-nourished. No distress.  HENT:  Head: Normocephalic and atraumatic.  Eyes: EOM are normal. Pupils are equal, round, and reactive to light. Right eye exhibits  no discharge and no hordeolum. No foreign body present in the right eye. Right conjunctiva is injected.  See picture below. Right upper lid with swelling, erythema without increased warmth, tenderness to palpation. Fluorescein stain with uptake at the 12 o'clock of right iris that extends slightly to the pupils.   IOP (tonopen): 21, 24, 26  Neurological: She is alert and oriented to person, place, and time.         UC Treatments / Results  Labs (all labs ordered are listed, but only abnormal results are displayed) Labs Reviewed - No data to display  EKG  EKG Interpretation None       Radiology No results found.  Procedures Procedures (including critical care time)  Medications Ordered in UC Medications - No data to display   Initial Impression / Assessment and Plan / UC Course  I have reviewed the triage vital signs and the nursing notes.  Pertinent labs & imaging results that were available during my care of the patient were reviewed by me and considered in my medical decision making (see chart for details).    32 year old female returns for worsening right eye pain, irritation after being seen 5 days ago and treated for conjunctivitis with ofloxacin. States started experiencing worsening vision 2 days ago without photophobia. Worsening foreign body sensation.  Fluorescein stain with corneal abrasion at the 12:00 region of the iris extending down to the pupils.  Given history and exam, will discharge patient in stable condition to the ophthalmologist office for further evaluation.   Case discussed with Dr. Tracie Harrier, who agrees to plan.  Final Clinical Impressions(s) / UC Diagnoses   Final diagnoses:  Abrasion of right cornea, initial encounter    ED Discharge Orders    None        Lurline Idol 04/24/17 1933

## 2017-04-24 NOTE — ED Triage Notes (Signed)
PT was seen here last Wednesday and given antibiotics for pink eye. PT reports continued eye pain, redness, a foreign body sensation, and blurred vision in affected eye.

## 2017-04-26 ENCOUNTER — Telehealth (HOSPITAL_COMMUNITY): Payer: Self-pay | Admitting: Emergency Medicine

## 2017-04-26 NOTE — Telephone Encounter (Signed)
Patient concerned for blood pressure readings.  Reports having headaches and continued issues with her eye and is currently going to ophthalmology office now for continued treatment of eye issue.  Patient reports blood pressure at home has been 140/101.  Patient dis performing random, frequent blood pressure readings.  Patient sounds very anxious.   Insurance kicks in the next few weeks   Spoke to dr Kellogglauenstein.  Patient to focus on eye issue and treatment.  Patient to get a pcp as soon as it is possible.  Meanwhile patient is to keep a blood pressure log, checking blood pressure once a day.  Instructed patient if she has chest pain, sob, numbness or tingling or the worst headache of her life to call 911/go to ed immediately.  Patient agreeable.  Patient states she is taking advil and tylenol for headache.  Told patient to limit advil use due to blood pressure.

## 2018-12-10 ENCOUNTER — Emergency Department (HOSPITAL_COMMUNITY)
Admission: EM | Admit: 2018-12-10 | Discharge: 2018-12-11 | Discharge status: Against Medical Advice | Payer: Managed Care, Other (non HMO) | Attending: Emergency Medicine | Admitting: Emergency Medicine

## 2018-12-10 ENCOUNTER — Encounter (HOSPITAL_COMMUNITY): Payer: Self-pay | Admitting: Emergency Medicine

## 2018-12-10 ENCOUNTER — Emergency Department (HOSPITAL_COMMUNITY): Payer: Managed Care, Other (non HMO)

## 2018-12-10 ENCOUNTER — Other Ambulatory Visit: Payer: Self-pay

## 2018-12-10 DIAGNOSIS — Z5321 Procedure and treatment not carried out due to patient leaving prior to being seen by health care provider: Secondary | ICD-10-CM | POA: Diagnosis not present

## 2018-12-10 DIAGNOSIS — R05 Cough: Secondary | ICD-10-CM | POA: Diagnosis present

## 2018-12-10 NOTE — ED Triage Notes (Signed)
Patient here from home with complaints of cough x2 months. Reports that she has mold in apartment and would like to get checked.

## 2019-08-16 ENCOUNTER — Encounter (HOSPITAL_COMMUNITY): Payer: Self-pay

## 2019-08-16 ENCOUNTER — Emergency Department (HOSPITAL_COMMUNITY): Payer: Self-pay

## 2019-08-16 ENCOUNTER — Other Ambulatory Visit: Payer: Self-pay

## 2019-08-16 ENCOUNTER — Emergency Department (HOSPITAL_COMMUNITY)
Admission: EM | Admit: 2019-08-16 | Discharge: 2019-08-16 | Disposition: A | Discharge status: Home / Self Care | Payer: Self-pay | Attending: Emergency Medicine | Admitting: Emergency Medicine

## 2019-08-16 DIAGNOSIS — Z91040 Latex allergy status: Secondary | ICD-10-CM | POA: Insufficient documentation

## 2019-08-16 DIAGNOSIS — K2900 Acute gastritis without bleeding: Secondary | ICD-10-CM | POA: Insufficient documentation

## 2019-08-16 DIAGNOSIS — Z79899 Other long term (current) drug therapy: Secondary | ICD-10-CM | POA: Insufficient documentation

## 2019-08-16 DIAGNOSIS — I10 Essential (primary) hypertension: Secondary | ICD-10-CM | POA: Insufficient documentation

## 2019-08-16 DIAGNOSIS — Z20822 Contact with and (suspected) exposure to covid-19: Secondary | ICD-10-CM | POA: Insufficient documentation

## 2019-08-16 DIAGNOSIS — F17210 Nicotine dependence, cigarettes, uncomplicated: Secondary | ICD-10-CM | POA: Insufficient documentation

## 2019-08-16 HISTORY — DX: Essential (primary) hypertension: I10

## 2019-08-16 HISTORY — DX: Bipolar disorder, unspecified: F31.9

## 2019-08-16 LAB — URINALYSIS, ROUTINE W REFLEX MICROSCOPIC
Bilirubin Urine: NEGATIVE
Glucose, UA: NEGATIVE mg/dL
Ketones, ur: NEGATIVE mg/dL
Leukocytes,Ua: NEGATIVE
Nitrite: NEGATIVE
Protein, ur: NEGATIVE mg/dL
Specific Gravity, Urine: 1.011 (ref 1.005–1.030)
pH: 8 (ref 5.0–8.0)

## 2019-08-16 LAB — CBC
HCT: 37.8 % (ref 36.0–46.0)
Hemoglobin: 12.2 g/dL (ref 12.0–15.0)
MCH: 28.7 pg (ref 26.0–34.0)
MCHC: 32.3 g/dL (ref 30.0–36.0)
MCV: 88.9 fL (ref 80.0–100.0)
Platelets: 336 10*3/uL (ref 150–400)
RBC: 4.25 MIL/uL (ref 3.87–5.11)
RDW: 19.3 % — ABNORMAL HIGH (ref 11.5–15.5)
WBC: 8.3 10*3/uL (ref 4.0–10.5)
nRBC: 0 % (ref 0.0–0.2)

## 2019-08-16 LAB — COMPREHENSIVE METABOLIC PANEL
ALT: 19 U/L (ref 0–44)
AST: 21 U/L (ref 15–41)
Albumin: 3.7 g/dL (ref 3.5–5.0)
Alkaline Phosphatase: 95 U/L (ref 38–126)
Anion gap: 11 (ref 5–15)
BUN: 8 mg/dL (ref 6–20)
CO2: 27 mmol/L (ref 22–32)
Calcium: 8.9 mg/dL (ref 8.9–10.3)
Chloride: 101 mmol/L (ref 98–111)
Creatinine, Ser: 0.85 mg/dL (ref 0.44–1.00)
GFR calc Af Amer: >60 mL/min (ref >60)
GFR calc non Af Amer: >60 mL/min (ref >60)
Glucose, Bld: 103 mg/dL — ABNORMAL HIGH (ref 70–99)
Potassium: 3.4 mmol/L — ABNORMAL LOW (ref 3.5–5.1)
Sodium: 139 mmol/L (ref 135–145)
Total Bilirubin: 0.5 mg/dL (ref 0.3–1.2)
Total Protein: 7.8 g/dL (ref 6.5–8.1)

## 2019-08-16 LAB — TROPONIN I (HIGH SENSITIVITY)
Troponin I (High Sensitivity): 5 ng/L (ref <18)
Troponin I (High Sensitivity): 6 ng/L (ref <18)

## 2019-08-16 LAB — I-STAT BETA HCG BLOOD, ED (MC, WL, AP ONLY): I-stat hCG, quantitative: <5 m[IU]/mL (ref <5)

## 2019-08-16 LAB — SARS CORONAVIRUS 2 BY RT PCR (HOSPITAL ORDER, PERFORMED IN ~~LOC~~ HOSPITAL LAB): SARS Coronavirus 2: NEGATIVE

## 2019-08-16 LAB — LIPASE, BLOOD: Lipase: 23 U/L (ref 11–51)

## 2019-08-16 MED ORDER — ONDANSETRON 4 MG PO TBDP
4.0000 mg | ORAL_TABLET | Freq: Three times a day (TID) | ORAL | 0 refills | Status: AC | PRN
Start: 2019-08-16 — End: ?

## 2019-08-16 MED ORDER — SODIUM CHLORIDE 0.9% FLUSH
3.0000 mL | Freq: Once | INTRAVENOUS | Status: AC
  Administered 2019-08-16: 3 mL via INTRAVENOUS

## 2019-08-16 MED ORDER — SODIUM CHLORIDE 0.9 % IV BOLUS
500.0000 mL | Freq: Once | INTRAVENOUS | Status: AC
  Administered 2019-08-16: 500 mL via INTRAVENOUS

## 2019-08-16 MED ORDER — ONDANSETRON HCL 4 MG/2ML IJ SOLN
4.0000 mg | Freq: Once | INTRAMUSCULAR | Status: AC
  Administered 2019-08-16: 4 mg via INTRAVENOUS
  Filled 2019-08-16: qty 2

## 2019-08-16 NOTE — ED Triage Notes (Signed)
Patient c/o left chest pain and SOB since last night.

## 2019-08-16 NOTE — Discharge Instructions (Signed)
Please continue to drink water at home.  Please avoid alcohol.  Please eat a bland diet.   I have given you zofran for nausea as needed.  If your symptoms worsen, you have other concerns please seek additional medical care and evaluation.

## 2019-08-16 NOTE — ED Provider Notes (Signed)
[facility]-EMERGENCY DEPT Provider Note   CSN: [id number] Arrival date & time: 08/16/19  7616     History Chief Complaint  Patient presents with  . Chest Pain    Emma Haynes is a 34 y.o. female with a past medical history of bipolar 1, hypertension, migraines, who presents today for evaluation of left chest pain and shortness of breath since last night. She reports that last night she developed pain in the middle of her chest. Today she has vomited 4 times, right before she started vomiting the pain radiated into her back and got worse. She states that currently she still feels nauseous. She denies constipation or diarrhea. No fevers. She reports that when she moves or walks around she gets short of breath.  The pain in her upper abdomen/chest feels like "a bag of air that needs to be popped."  She has not been vaccinated against covid, has not previously been diagnosed with covid.    Of note she reports that she is not taking any of her psychiatric medications as she does not like how they made her feel.    She states that she has 1 monster drink that she drinks throughout the entire day. Denies other significant sources of caffeine. She reports that she used to drink a lot more Monster and that one energy drink is a reduction for her.  She denies any fevers.     No prior abdominal surgeries.   HPI     Past Medical History:  Diagnosis Date  . Bipolar 1 disorder (HCC)   . Hypertension   . Migraine     There are no problems to display for this patient.   Past Surgical History:  Procedure Laterality Date  . TONSILLECTOMY    . WISDOM TOOTH EXTRACTION       OB History   No obstetric history on file.     Family History  Problem Relation Age of Onset  . Diabetes Mother   . Hypertension Mother     Social History   Tobacco Use  . Smoking status: Current Every Day Smoker  . Smokeless tobacco: Current User  Substance Use Topics  . Alcohol use:  No  . Drug use: No    Home Medications Prior to Admission medications   Medication Sig Start Date End Date Taking? Authorizing Provider  acetaminophen (TYLENOL) 500 MG tablet Take 500-1,000 mg by mouth every 6 (six) hours as needed for mild pain, moderate pain or headache.    Yes [provider]  cetirizine (ZYRTEC) 10 MG tablet Take 1 tablet by mouth daily as needed for allergies.  11/12/18 11/12/19 Yes [provider]  esomeprazole (NEXIUM) 40 MG capsule Take 40 mg by mouth daily.   Yes [provider]  ferrous sulfate 325 (65 FE) MG tablet Take 1 tablet by mouth daily. 02/26/19  Yes [provider]  hydrochlorothiazide (HYDRODIURIL) 25 MG tablet Take 25 mg by mouth daily.   Yes [provider]  hydrOXYzine (VISTARIL) 25 MG capsule Take 1 capsule by mouth as needed for anxiety (sleep).  06/27/18  Yes [provider]  ibuprofen (ADVIL) 800 MG tablet Take 800 mg by mouth every 8 (eight) hours as needed for moderate pain.    Yes [provider]  Multiple Vitamins-Minerals (MULTIVITAMIN WITH MINERALS) tablet Take 1 tablet by mouth daily.   Yes [provider]  propranolol ER (INDERAL LA) 60 MG 24 hr capsule Take 1 capsule by mouth daily. 04/18/19  Yes [provider]  azithromycin (ZITHROMAX) 250 MG tablet Take 1 tablet (250 mg total) by mouth daily. Take first 2 tablets together, then 1 every day until finished. Patient not taking: Reported on 08/16/2019 04/19/17   Belinda Fisher, PA-C  benzonatate (TESSALON) 100 MG capsule Take 1 capsule (100 mg total) by mouth every 8 (eight) hours. Patient not taking: Reported on 08/16/2019 04/19/17   Belinda Fisher, PA-C  ofloxacin (OCUFLOX) 0.3 % ophthalmic solution 1 drop every 4 hours for the first 2 days, then 4 times a day for 5 days Patient not taking: Reported on 08/16/2019 04/19/17   Belinda Fisher, PA-C  ondansetron (ZOFRAN ODT) 4 MG disintegrating tablet Take 1 tablet (4 mg total) by mouth every 8  (eight) hours as needed for nausea or vomiting. 08/16/19   Cristina Gong, PA-C  pantoprazole (PROTONIX) 20 MG tablet Take 1 tablet (20 mg total) by mouth daily. Patient not taking: Reported on 08/16/2019 04/27/16   Antony Madura, PA-C    Allergies    Latex  Review of Systems   Review of Systems  Constitutional: Negative for chills and fever.  Respiratory: Positive for shortness of breath (With exertion).   Cardiovascular: Positive for chest pain. Negative for palpitations and leg swelling.  Gastrointestinal: Positive for abdominal pain, nausea and vomiting. Negative for constipation and diarrhea.  Genitourinary: Negative for dysuria.  Musculoskeletal: Positive for back pain.  Neurological: Negative for weakness and headaches.  All other systems reviewed and are negative.   Physical Exam Updated Vital Signs BP (!) 134/96 (BP Location: Left Wrist)   Pulse 78   Temp 97.7 F (36.5 C) (Oral)   Resp 14   Ht 5\' 6"  (1.676 m)   Wt 108.9 kg   LMP 08/09/2019 (Approximate)   SpO2 100%   BMI 38.74 kg/m   Physical Exam Vitals and nursing note reviewed.  Constitutional:      General: She is not in acute distress.    Appearance: She is well-developed.  HENT:     Head: Normocephalic and atraumatic.  Eyes:     Conjunctiva/sclera: Conjunctivae normal.  Cardiovascular:     Rate and Rhythm: Normal rate and regular rhythm.     Pulses:          Dorsalis pedis pulses are 2+ on the right side and 2+ on the left side.       Posterior tibial pulses are 2+ on the right side and 2+ on the left side.     Heart sounds: Normal heart sounds. No murmur.  Pulmonary:     Effort: Pulmonary effort is normal. No accessory muscle usage or respiratory distress.     Breath sounds: Normal breath sounds. No decreased breath sounds.  Abdominal:     Palpations: Abdomen is soft.     Tenderness: There is abdominal tenderness in the right lower quadrant and epigastric area.  Musculoskeletal:     Cervical  back: Normal range of motion and neck supple.     Right lower leg: No tenderness. No edema.     Left lower leg: No tenderness. No edema.  Skin:    General: Skin is warm and dry.  Neurological:     General: No focal deficit present.     Mental Status: She is alert.  Psychiatric:        Mood and Affect: Mood is anxious.        Behavior: Behavior normal.     ED Results / Procedures /  Treatments   Labs (all labs ordered are listed, but only abnormal results are displayed) Labs Reviewed  CBC - Abnormal; Notable for the following components:      Result Value   RDW 19.3 (*)    All other components within normal limits  COMPREHENSIVE METABOLIC PANEL - Abnormal; Notable for the following components:   Potassium 3.4 (*)    Glucose, Bld 103 (*)    All other components within normal limits  URINALYSIS, ROUTINE W REFLEX MICROSCOPIC - Abnormal; Notable for the following components:   Color, Urine YELLOW (*)    APPearance HAZY (*)    Hgb urine dipstick SMALL (*)    Bacteria, UA RARE (*)    All other components within normal limits  SARS CORONAVIRUS 2 BY RT PCR (HOSPITAL ORDER, PERFORMED IN [facility])  LIPASE, BLOOD  I-STAT BETA HCG BLOOD, ED ([facility], [facility], [facility] ONLY)  TROPONIN I (HIGH SENSITIVITY)  TROPONIN I (HIGH SENSITIVITY)    EKG None  Radiology DG Chest 2 View  Result Date: 08/16/2019 CLINICAL DATA:  LEFT chest pain and shortness of breath since last night, nausea and vomiting since 0700 hours today, pain all over, history hypertension, smoker EXAM: CHEST - 2 VIEW COMPARISON:  12/10/2018 FINDINGS: Normal heart size, mediastinal contours, and pulmonary vascularity. Minimal central chronic peribronchial thickening. No acute infiltrate, pleural effusion or pneumothorax. Osseous structures unremarkable. IMPRESSION: Minimal chronic bronchitic changes without infiltrate. Electronically Signed   By: Ulyses Southward M.D.   On: 08/16/2019 10:16    Procedures Procedures (including  critical care time)  Medications Ordered in ED Medications  sodium chloride flush (NS) 0.9 % injection 3 mL (3 mLs Intravenous Given 08/16/19 1140)  ondansetron (ZOFRAN) injection 4 mg (4 mg Intravenous Given 08/16/19 1141)  sodium chloride 0.9 % bolus 500 mL (0 mLs Intravenous Stopped 08/16/19 1520)    ED Course  I have reviewed the triage vital signs and the nursing notes.  Pertinent labs & imaging results that were available during my care of the patient were reviewed by me and considered in my medical decision making (see chart for details).    MDM Rules/Calculators/A&P                     Patient is a 34 year old woman who presents today for evaluation of substernal chest pain/epigastric pain and pressure since last night.  She has been nauseous and vomited multiple times.  She denies any urinary symptoms, UA with more squamous epithelial cells than bacteria, suspect contamination.  Covid test is negative.  CBC without significant leukocytosis or anemia.  Lipase is not elevated, pregnancy test is negative.  Troponin x2 is not elevated.  PERC is negative.  CMP with minimal hypokalemia which I suspect is secondary to vomiting, recommended p.o. intake.  She was treated with Zofran in the emergency room.  She was given IV fluids.  After this repeat exam showed that her abdomen was no longer tender, she felt much better stating that she felt back to normal and wished for discharge home.  She was able to pass p.o. challenge.  No evidence of acute abdomen on repeat exam.  She will be given a prescription for Zofran for nausea as needed.  Recommended bland diet, continued p.o. rehydration and conservative measures.    Return precautions were discussed with patient who states their understanding.  At the time of discharge patient denied any unaddressed complaints or concerns.  Patient is agreeable for discharge home.  Note: Portions of this report may have been transcribed using voice recognition  software. Every effort was made to ensure accuracy; however, inadvertent computerized transcription errors may be present   Final Clinical Impression(s) / ED Diagnoses Final diagnoses:  Acute gastritis, presence of bleeding unspecified, unspecified gastritis type    Rx / DC Orders ED Discharge Orders         Ordered    ondansetron (ZOFRAN ODT) 4 MG disintegrating tablet  Every 8 hours PRN     08/16/19 1459           Lorin Glass, Hershal Coria 08/16/19 1825    Davonna Belling, MD 08/17/19 1524

## 2019-08-16 NOTE — ED Notes (Signed)
Pt disconnected fluids from IV because the fluids had her going back and forth to bathroom.

## 2021-06-24 IMAGING — CR DG CHEST 2V
2 series · 2 of 2 positions shown · non-contrast
Comparison: None.

CLINICAL DATA: Cough

EXAM:
CHEST - 2 VIEW

[w chest pa]
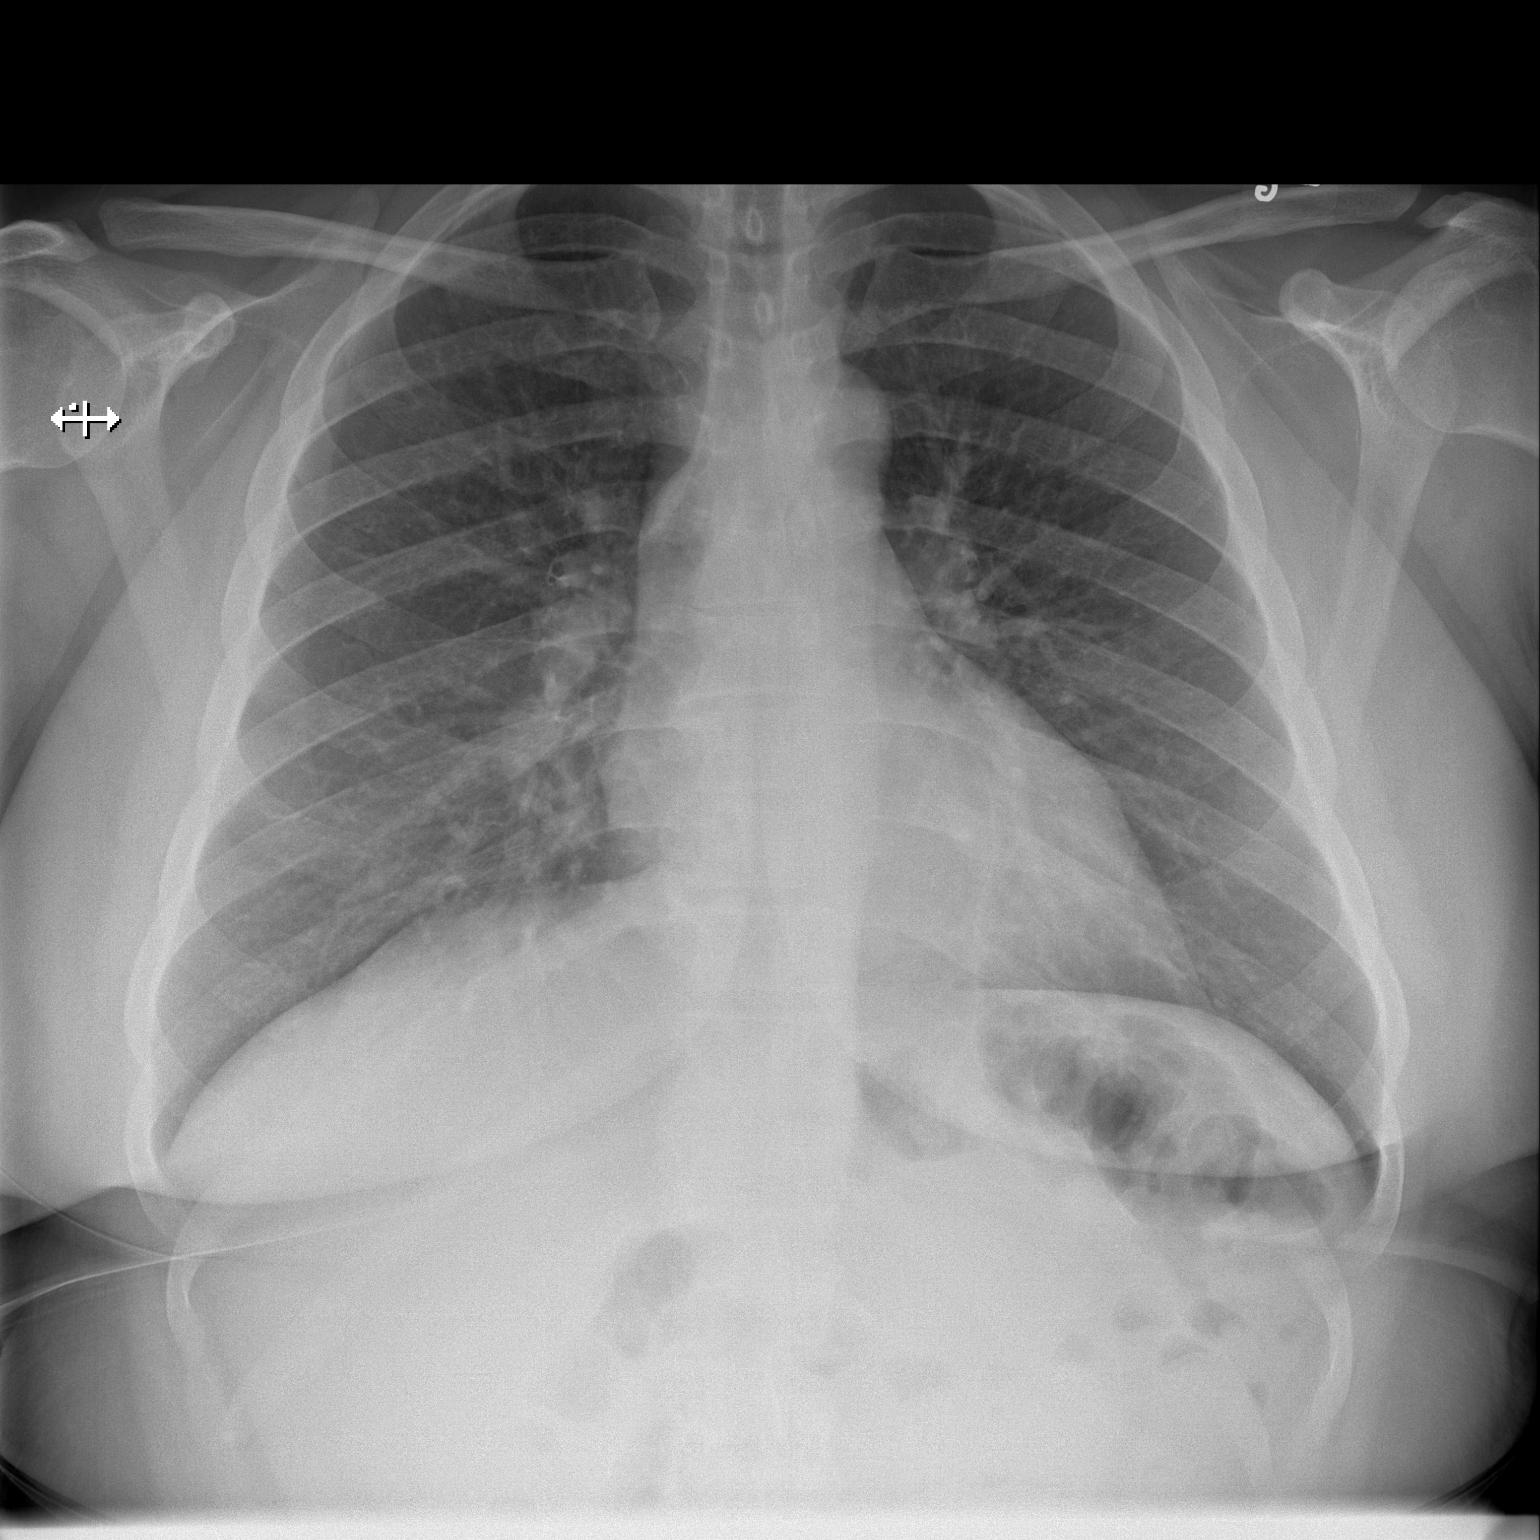

[w chest lat]
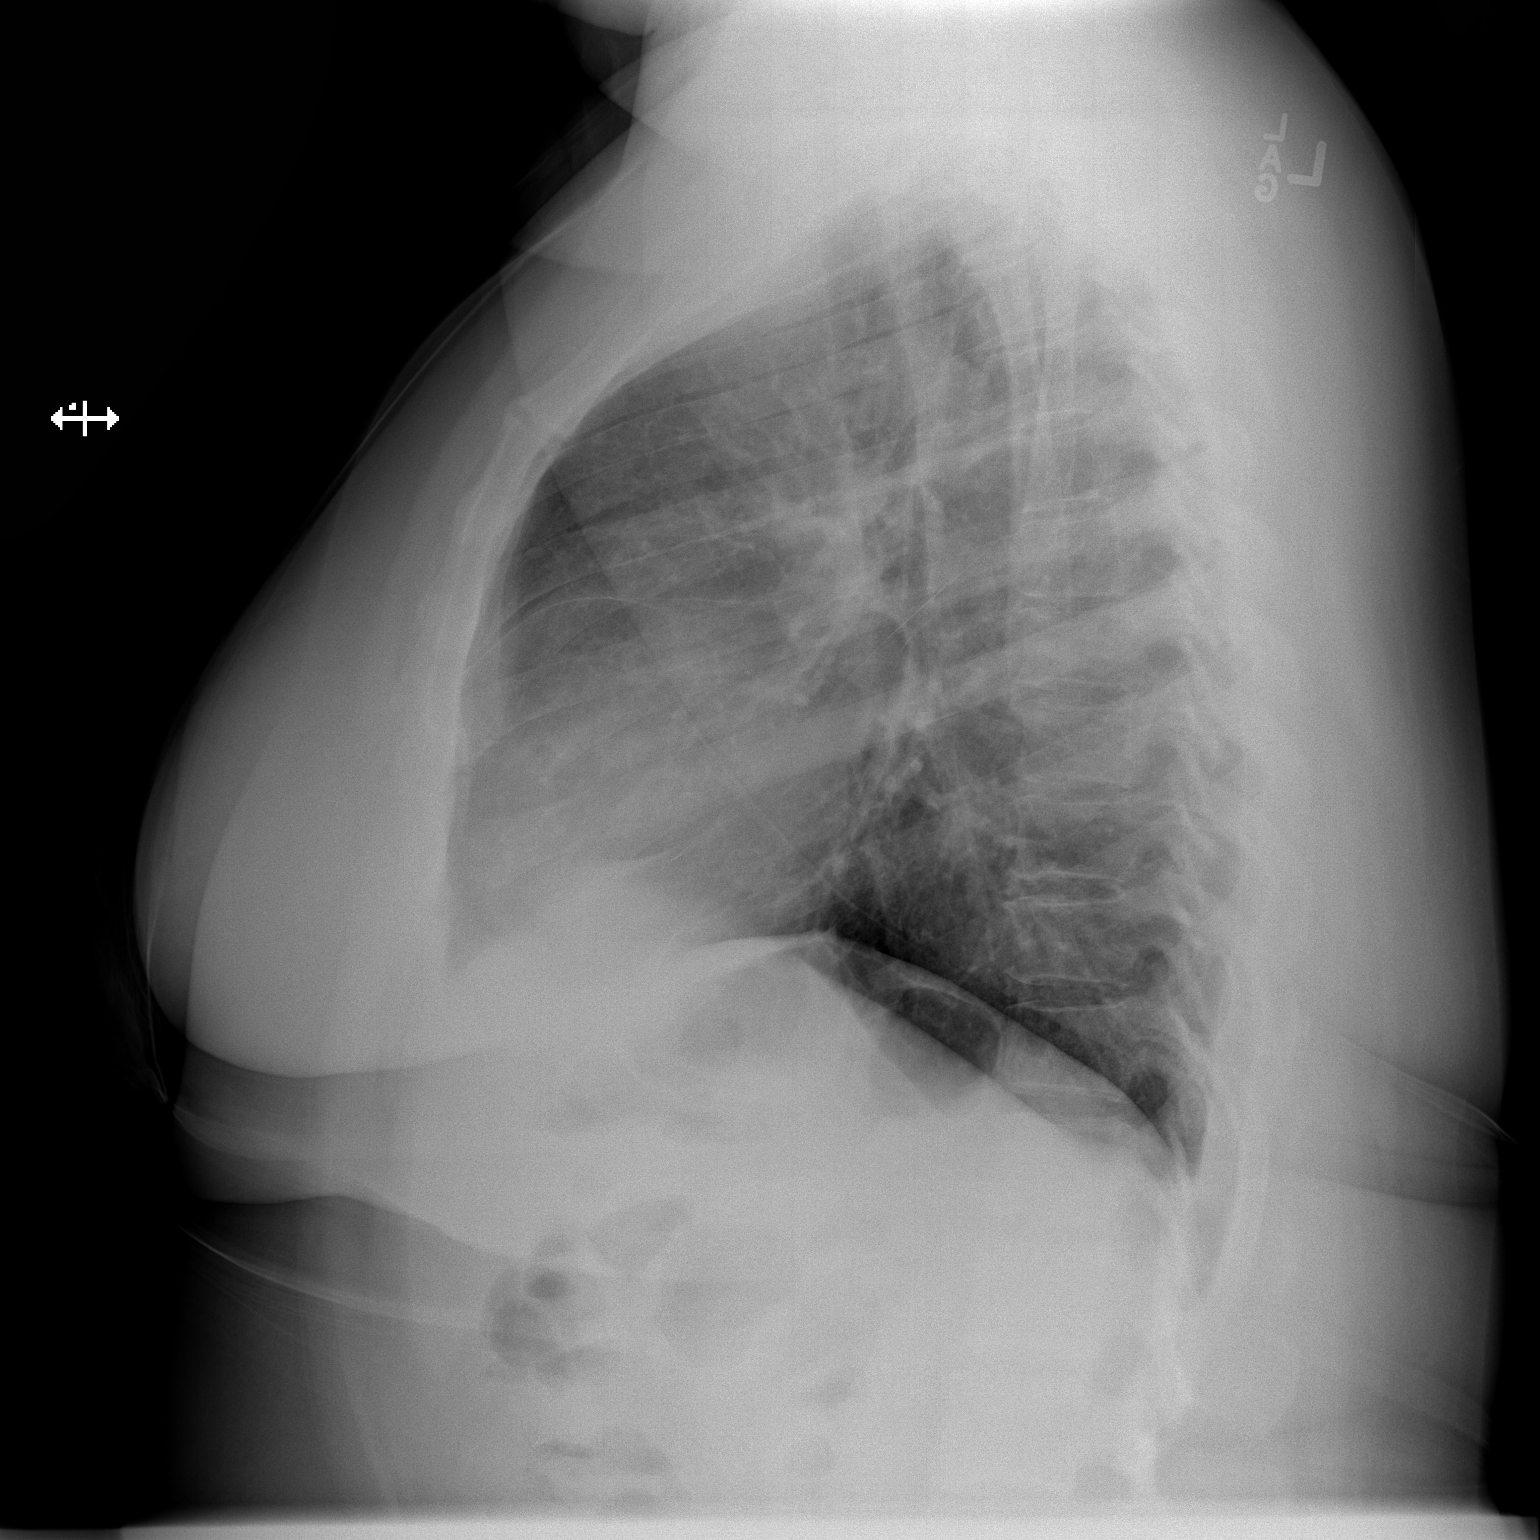

[2 of 2 positions shown; findings below may reference images not displayed]

FINDINGS: The heart size and mediastinal contours are within normal limits.
Both lungs are clear. The visualized skeletal structures are
unremarkable.
IMPRESSION: No active cardiopulmonary disease.

## 2022-02-28 IMAGING — CR DG CHEST 2V
2 series · 2 of 2 positions shown · non-contrast
Comparison: 12/10/2018

CLINICAL DATA: LEFT chest pain and shortness of breath since last
night, nausea and vomiting since 4644 hours today, pain all over,
history hypertension, smoker

EXAM:
CHEST - 2 VIEW

[w chest pa]
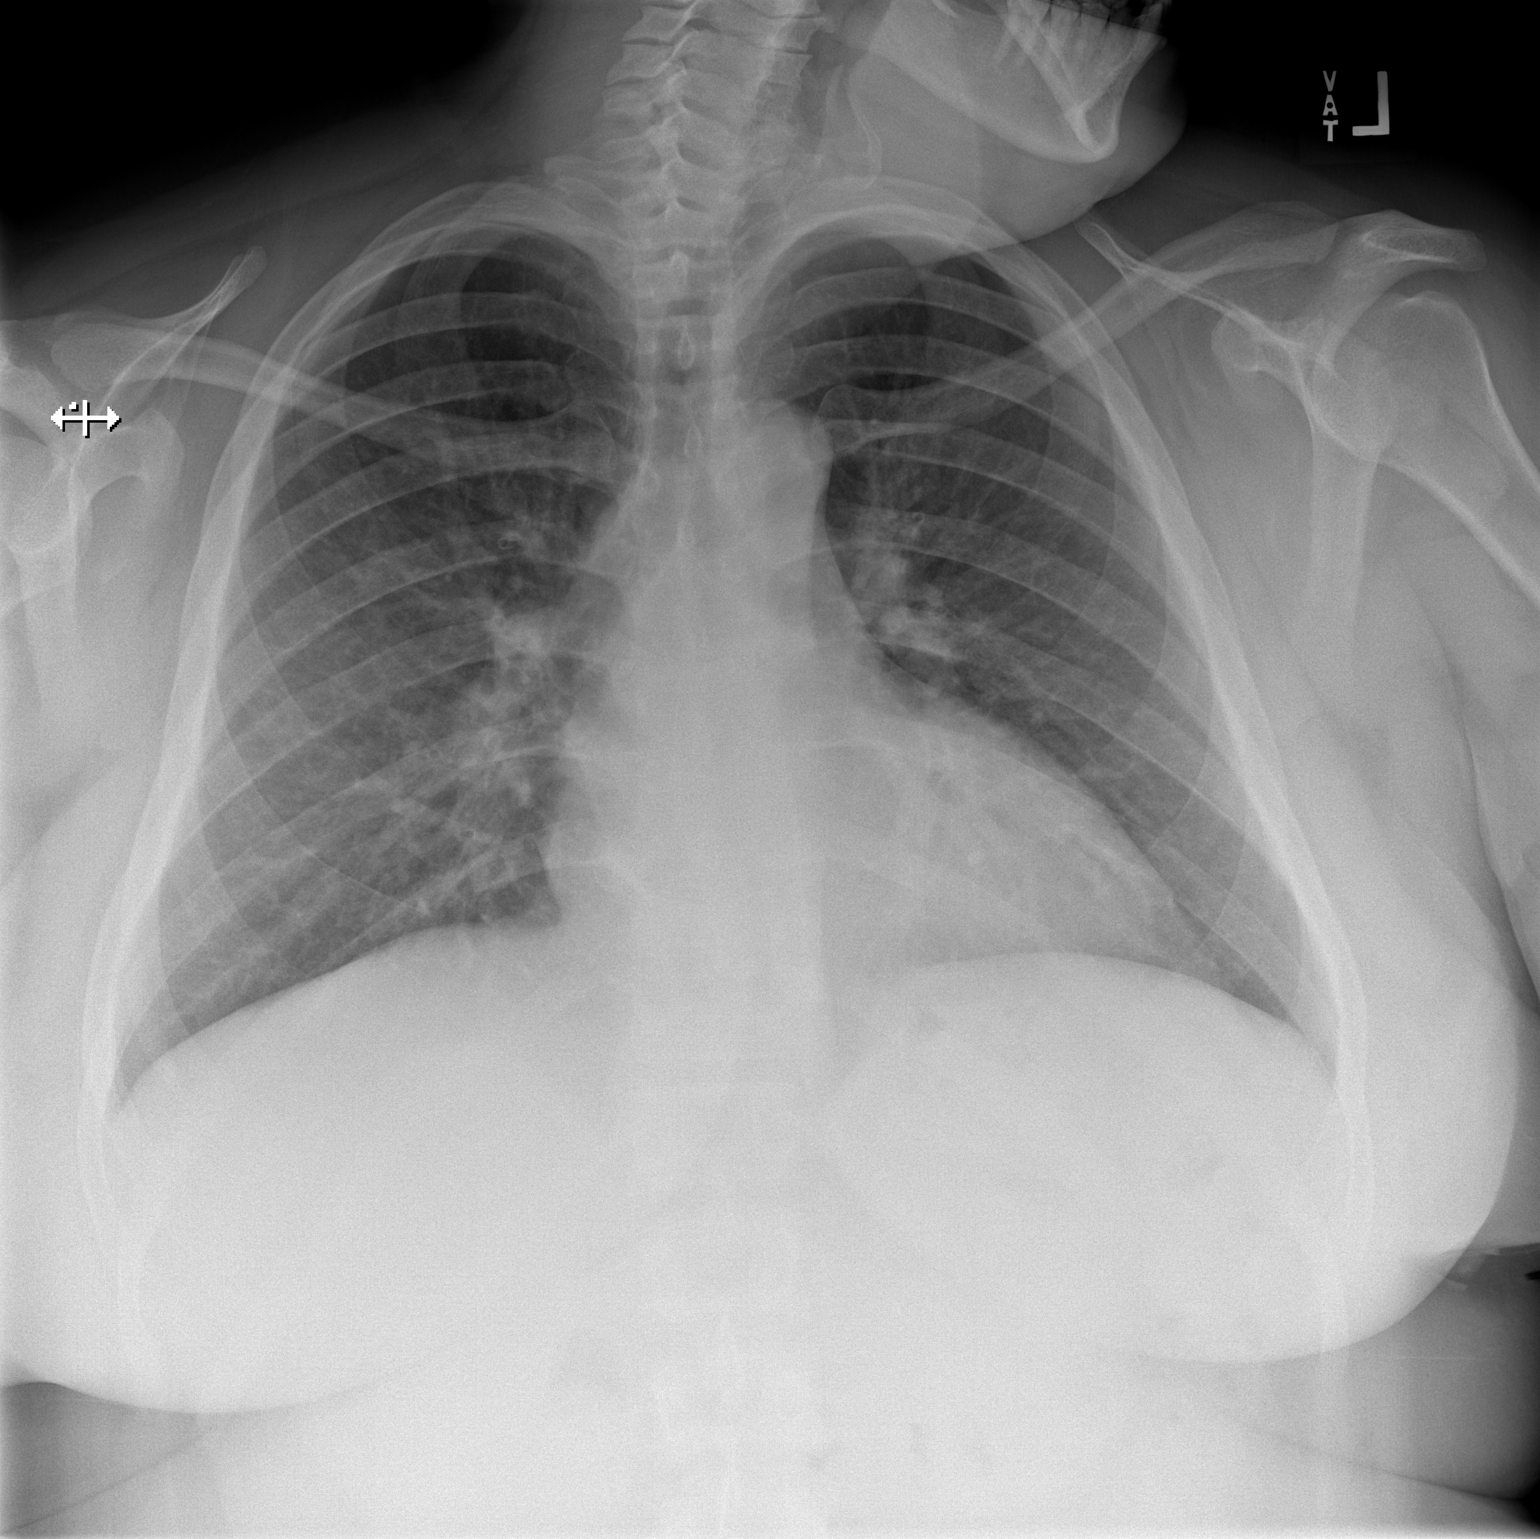

[w chest lat]
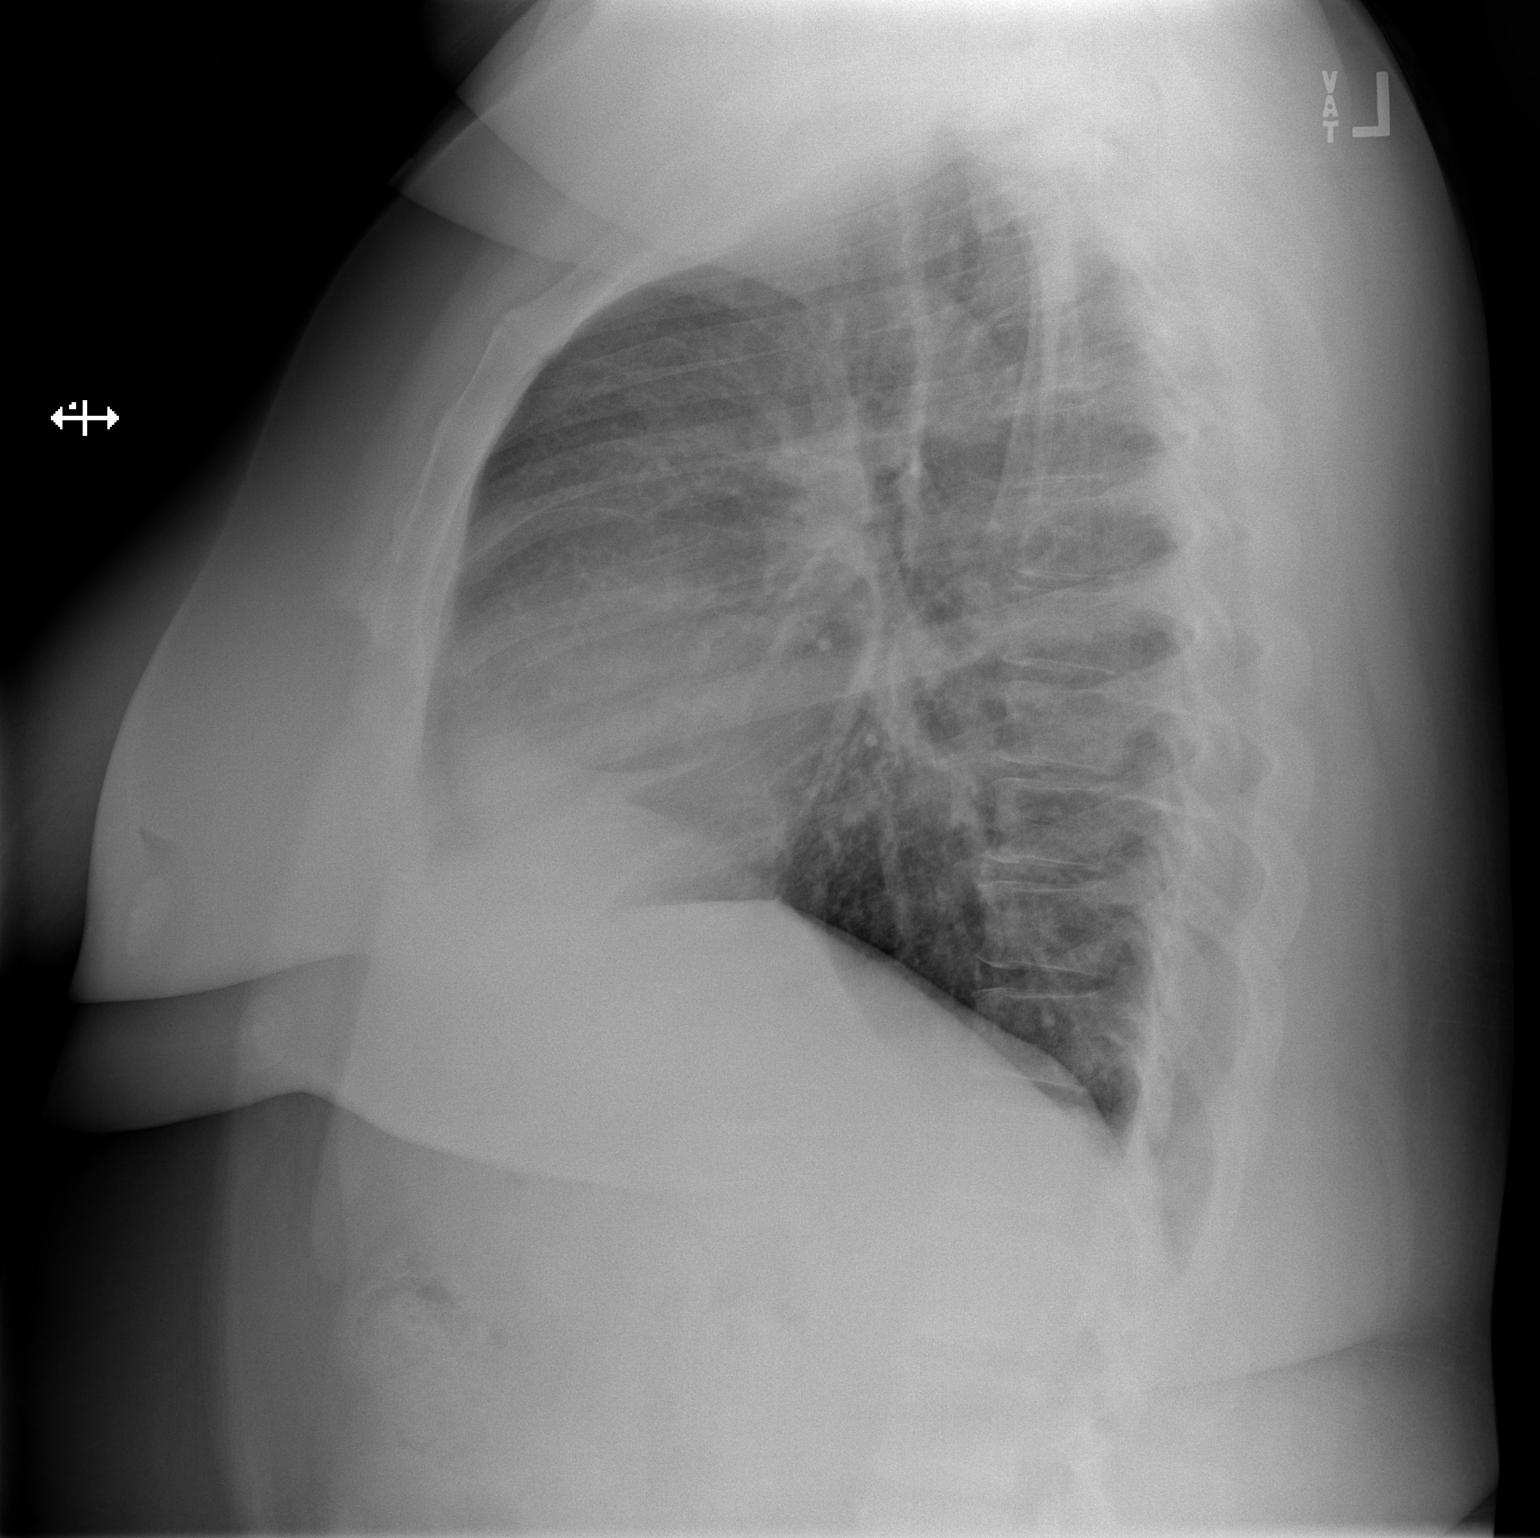

[2 of 2 positions shown; findings below may reference images not displayed]

FINDINGS: Normal heart size, mediastinal contours, and pulmonary vascularity.

Minimal central chronic peribronchial thickening.

No acute infiltrate, pleural effusion or pneumothorax.

Osseous structures unremarkable.
IMPRESSION: Minimal chronic bronchitic changes without infiltrate.
